# Patient Record
Sex: Female | Born: 1995 | Race: Black or African American | Hispanic: No | Marital: Single | Smoking: Never smoker
Health system: Southern US, Community
[De-identification: ages and names within clinical notes are randomized; demographics above are authoritative.]

## PROBLEM LIST (undated history)

## (undated) DIAGNOSIS — L732 Hidradenitis suppurativa: Secondary | ICD-10-CM

## (undated) DIAGNOSIS — F32A Depression, unspecified: Secondary | ICD-10-CM

## (undated) DIAGNOSIS — R87629 Unspecified abnormal cytological findings in specimens from vagina: Secondary | ICD-10-CM

## (undated) DIAGNOSIS — E119 Type 2 diabetes mellitus without complications: Secondary | ICD-10-CM

## (undated) DIAGNOSIS — G709 Myoneural disorder, unspecified: Secondary | ICD-10-CM

## (undated) DIAGNOSIS — I10 Essential (primary) hypertension: Secondary | ICD-10-CM

## (undated) DIAGNOSIS — F419 Anxiety disorder, unspecified: Secondary | ICD-10-CM

## (undated) DIAGNOSIS — T7840XA Allergy, unspecified, initial encounter: Secondary | ICD-10-CM

## (undated) DIAGNOSIS — K219 Gastro-esophageal reflux disease without esophagitis: Secondary | ICD-10-CM

## (undated) HISTORY — DX: Myoneural disorder, unspecified: G70.9

## (undated) HISTORY — DX: Unspecified abnormal cytological findings in specimens from vagina: R87.629

## (undated) HISTORY — PX: WISDOM TOOTH EXTRACTION: SHX21

## (undated) HISTORY — DX: Essential (primary) hypertension: I10

## (undated) HISTORY — DX: Gastro-esophageal reflux disease without esophagitis: K21.9

## (undated) HISTORY — DX: Depression, unspecified: F32.A

## (undated) HISTORY — DX: Anxiety disorder, unspecified: F41.9

## (undated) HISTORY — DX: Type 2 diabetes mellitus without complications: E11.9

## (undated) HISTORY — DX: Allergy, unspecified, initial encounter: T78.40XA

## (undated) HISTORY — DX: Hidradenitis suppurativa: L73.2

---
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
## 2016-01-01 ENCOUNTER — Telehealth: Payer: Self-pay | Admitting: *Deleted

## 2016-01-01 NOTE — Telephone Encounter (Signed)
Call placed to patient. LMTRC.  

## 2016-01-01 NOTE — Telephone Encounter (Signed)
-----   Message from Salley Scarlet, MD sent at 01/01/2016 11:08 AM EST ----- Regarding: FW: F/U CBG lantus down to 5 units Call and see what CBG are running I had decreased lantus to 5 units with her oral meds.  Put in chart notes  ----- Message -----    From: Salley Scarlet, MD    Sent: 12/30/2015      To: Salley Scarlet, MD Subject: F/U CBG lantus down to 5 units

## 2016-01-05 NOTE — Telephone Encounter (Signed)
Call placed to patient. LMTRC.  

## 2016-01-06 NOTE — Telephone Encounter (Signed)
Call placed to patient. LMTRC.  

## 2016-01-09 NOTE — Telephone Encounter (Signed)
Multiple calls placed to patient with no answer and no return call.   Message to be closed.  

## 2016-01-15 ENCOUNTER — Encounter: Payer: Self-pay | Admitting: Family Medicine

## 2016-02-24 ENCOUNTER — Ambulatory Visit (INDEPENDENT_AMBULATORY_CARE_PROVIDER_SITE_OTHER): Payer: No Typology Code available for payment source | Admitting: Family Medicine

## 2016-02-24 ENCOUNTER — Encounter: Payer: Self-pay | Admitting: Family Medicine

## 2016-02-24 VITALS — BP 132/80 | HR 78 | Temp 98.6°F | Resp 14 | Ht 66.0 in | Wt 213.0 lb

## 2016-02-24 DIAGNOSIS — Z2839 Other underimmunization status: Secondary | ICD-10-CM

## 2016-02-24 DIAGNOSIS — Z23 Encounter for immunization: Secondary | ICD-10-CM

## 2016-02-24 DIAGNOSIS — Z9189 Other specified personal risk factors, not elsewhere classified: Secondary | ICD-10-CM

## 2016-02-24 DIAGNOSIS — IMO0001 Reserved for inherently not codable concepts without codable children: Secondary | ICD-10-CM

## 2016-02-24 DIAGNOSIS — Z9289 Personal history of other medical treatment: Secondary | ICD-10-CM | POA: Diagnosis not present

## 2016-02-24 DIAGNOSIS — Z794 Long term (current) use of insulin: Secondary | ICD-10-CM | POA: Diagnosis not present

## 2016-02-24 DIAGNOSIS — E1165 Type 2 diabetes mellitus with hyperglycemia: Secondary | ICD-10-CM

## 2016-02-24 DIAGNOSIS — E669 Obesity, unspecified: Secondary | ICD-10-CM | POA: Diagnosis not present

## 2016-02-24 LAB — HEMOGLOBIN A1C, FINGERSTICK: Hgb A1C (fingerstick): 6.2 % — ABNORMAL HIGH (ref <5.7)

## 2016-02-24 NOTE — Assessment & Plan Note (Signed)
Goal is 10lb weight loss

## 2016-02-24 NOTE — Progress Notes (Signed)
Patient ID: Maria Aguilar, female   DOB: 06/10/1996, 20 y.o.   MRN: [id number]   Subjective:    Patient ID: Maria Aguilar, female    DOB: [birth year], 20 y.o.   MRN: [id number]  Patient presents for 3 month F/U Share follow-up chronic medical problems are diabetes mellitus type 2. Her last A1c was 6.1%. She is currently on Janumet 50/1000 one by mouth twice a day she is also on Lantus 5 units. She states her blood sugars have been about 150 fasting sometimes up to 200. She has intentionally lost 7 pounds since her last visit 3 months ago. She is trying to be more intentional but what she eats. She does eat a lot of [employer] and she works there and can get a meal for free. Next  She asked about a college immunizations she is missing an MMR as well as Varicella on her records she is also due for meningitis vaccines    Review Of Systems:  GEN- denies fatigue, fever, weight loss,weakness, recent illness HEENT- denies eye drainage, change in vision, nasal discharge, CVS- denies chest pain, palpitations RESP- denies SOB, cough, wheeze ABD- denies N/V, change in stools, abd pain GU- denies dysuria, hematuria, dribbling, incontinence MSK- denies joint pain, muscle aches, injury Neuro- denies headache, dizziness, syncope, seizure activity       Objective:    BP 132/80 mmHg  Pulse 78  Temp(Src) 98.6 F (37 C) (Oral)  Resp 14  Ht 5' 6"  (1.676 m)  Wt 213 lb (96.616 kg)  BMI 34.40 kg/m2 GEN- NAD, alert and oriented x3 HEENT- PERRL, EOMI, non injected sclera, pink conjunctiva, MMM, oropharynx clear CVS- RRR, no murmur RESP-CTAB EXT- No edema Pulses- Radial  2+  A1C 6.2%      Assessment & Plan:      Problem List Items Addressed This Visit    Obesity    Goal is 10lb weight loss      Diabetes mellitus type II, uncontrolled ([place]) - Primary    D/C Lantus Continue to work on dietary changes Referral to eye doctor given       Relevant Orders   Basic metabolic panel   CBC with  Differential/Platelet   Hemoglobin A1C, fingerstick   Lipid panel   Ambulatory referral to Ophthalmology    Other Visit Diagnoses    Incomplete immunization status        Relevant Orders    Varicella zoster antibody, IgG    Measles/Mumps/Rubella Immunity       Note: This dictation was prepared with Dragon dictation along with smaller phrase technology. Any transcriptional errors that result from this process are unintentional.

## 2016-02-24 NOTE — Addendum Note (Signed)
Addended by: Phillips OdorSIX, CHRISTINA H on: 02/24/2016 05:14 PM   Modules accepted: Orders

## 2016-02-24 NOTE — Assessment & Plan Note (Signed)
D/C Lantus Continue to work on dietary changes Referral to eye doctor given

## 2016-02-24 NOTE — Patient Instructions (Addendum)
Referral to eye doctor- Dr. Nile RiggsShapiro  Meningitis vaccine given We will call with lab results Stop the lantus Continue to work on diet and weight loss Goal 10 pounds by next visit F/U July before College for Physical

## 2016-02-25 LAB — MEASLES/MUMPS/RUBELLA IMMUNITY
Mumps IgG: 136 AU/mL — ABNORMAL HIGH (ref <9.00)
Rubella: 6.18 Index — ABNORMAL HIGH (ref <0.90)
Rubeola IgG: >300 AU/mL — ABNORMAL HIGH (ref <25.00)

## 2016-02-25 LAB — BASIC METABOLIC PANEL
BUN: 12 mg/dL (ref 7–20)
CALCIUM: 9.8 mg/dL (ref 8.9–10.4)
CHLORIDE: 104 mmol/L (ref 98–110)
CO2: 27 mmol/L (ref 20–31)
Creat: 0.52 mg/dL (ref 0.50–1.00)
Glucose, Bld: 81 mg/dL (ref 70–99)
Potassium: 4.2 mmol/L (ref 3.8–5.1)
SODIUM: 141 mmol/L (ref 135–146)

## 2016-02-25 LAB — LIPID PANEL
CHOL/HDL RATIO: 4.5 ratio (ref <=5.0)
CHOLESTEROL: 222 mg/dL — AB (ref 125–170)
HDL: 49 mg/dL (ref 36–76)
LDL Cholesterol: 146 mg/dL — ABNORMAL HIGH (ref <110)
TRIGLYCERIDES: 137 mg/dL — AB (ref 40–136)
VLDL: 27 mg/dL (ref <30)

## 2016-02-25 LAB — CBC WITH DIFFERENTIAL/PLATELET
BASOS ABS: 0 10*3/uL (ref 0.0–0.1)
BASOS PCT: 0 % (ref 0–1)
Eosinophils Absolute: 0.2 10*3/uL (ref 0.0–0.7)
Eosinophils Relative: 2 % (ref 0–5)
HCT: 38.9 % (ref 36.0–46.0)
HEMOGLOBIN: 12.4 g/dL (ref 12.0–15.0)
Lymphocytes Relative: 33 % (ref 12–46)
Lymphs Abs: 3.3 10*3/uL (ref 0.7–4.0)
MCH: 26.2 pg (ref 26.0–34.0)
MCHC: 31.9 g/dL (ref 30.0–36.0)
MCV: 82.2 fL (ref 78.0–100.0)
MONOS PCT: 7 % (ref 3–12)
MPV: 9.4 fL (ref 8.6–12.4)
Monocytes Absolute: 0.7 10*3/uL (ref 0.1–1.0)
NEUTROS ABS: 5.8 10*3/uL (ref 1.7–7.7)
NEUTROS PCT: 58 % (ref 43–77)
Platelets: 432 10*3/uL — ABNORMAL HIGH (ref 150–400)
RBC: 4.73 MIL/uL (ref 3.87–5.11)
RDW: 13.3 % (ref 11.5–15.5)
WBC: 10 10*3/uL (ref 4.0–10.5)

## 2016-02-25 LAB — VARICELLA ZOSTER ANTIBODY, IGG: Varicella IgG: 1250 Index — ABNORMAL HIGH (ref <135.00)

## 2016-03-03 LAB — HM DIABETES EYE EXAM

## 2016-03-09 ENCOUNTER — Encounter: Payer: Self-pay | Admitting: Family Medicine

## 2016-05-25 ENCOUNTER — Encounter: Payer: Self-pay | Admitting: Family Medicine

## 2016-05-26 ENCOUNTER — Ambulatory Visit (INDEPENDENT_AMBULATORY_CARE_PROVIDER_SITE_OTHER): Payer: No Typology Code available for payment source | Admitting: Family Medicine

## 2016-05-26 DIAGNOSIS — Z23 Encounter for immunization: Secondary | ICD-10-CM

## 2016-09-13 ENCOUNTER — Ambulatory Visit (INDEPENDENT_AMBULATORY_CARE_PROVIDER_SITE_OTHER): Payer: No Typology Code available for payment source | Admitting: Family Medicine

## 2016-09-13 ENCOUNTER — Encounter: Payer: Self-pay | Admitting: Family Medicine

## 2016-09-13 VITALS — BP 130/84 | HR 90 | Temp 97.9°F | Resp 18 | Wt 225.0 lb

## 2016-09-13 DIAGNOSIS — R300 Dysuria: Secondary | ICD-10-CM | POA: Diagnosis not present

## 2016-09-13 DIAGNOSIS — N898 Other specified noninflammatory disorders of vagina: Secondary | ICD-10-CM

## 2016-09-13 DIAGNOSIS — Z794 Long term (current) use of insulin: Secondary | ICD-10-CM

## 2016-09-13 DIAGNOSIS — Z6836 Body mass index (BMI) 36.0-36.9, adult: Secondary | ICD-10-CM

## 2016-09-13 DIAGNOSIS — E6609 Other obesity due to excess calories: Secondary | ICD-10-CM | POA: Diagnosis not present

## 2016-09-13 DIAGNOSIS — E1165 Type 2 diabetes mellitus with hyperglycemia: Secondary | ICD-10-CM

## 2016-09-13 DIAGNOSIS — IMO0001 Reserved for inherently not codable concepts without codable children: Secondary | ICD-10-CM

## 2016-09-13 LAB — URINALYSIS, ROUTINE W REFLEX MICROSCOPIC
BILIRUBIN URINE: NEGATIVE
Leukocytes, UA: NEGATIVE
NITRITE: NEGATIVE
Specific Gravity, Urine: 1.02 (ref 1.001–1.035)
pH: 5.5 (ref 5.0–8.0)

## 2016-09-13 LAB — URINALYSIS, MICROSCOPIC ONLY
CRYSTALS: NONE SEEN [HPF]
Casts: NONE SEEN [LPF]
Yeast: NONE SEEN [HPF]

## 2016-09-13 LAB — WET PREP FOR TRICH, YEAST, CLUE
Clue Cells Wet Prep HPF POC: NONE SEEN
Trich, Wet Prep: NONE SEEN

## 2016-09-13 LAB — HEMOGLOBIN A1C, FINGERSTICK: Hgb A1C (fingerstick): 10.8 % — ABNORMAL HIGH (ref <5.7)

## 2016-09-13 MED ORDER — CANAGLIFLOZIN 100 MG PO TABS: 100.0000 mg | ORAL_TABLET | Freq: Every day | ORAL | 2 refills | Status: DC

## 2016-09-13 MED ORDER — CLOTRIMAZOLE-BETAMETHASONE 1-0.05 % EX CREA: 1.0000 "application " | TOPICAL_CREAM | Freq: Two times a day (BID) | CUTANEOUS | 1 refills | Status: DC

## 2016-09-13 MED ORDER — FLUCONAZOLE 150 MG PO TABS: ORAL_TABLET | ORAL | 1 refills | Status: DC

## 2016-09-13 NOTE — Progress Notes (Signed)
   Subjective:    Patient ID: Maria Aguilar, female    DOB: [birth year], 20 y.o.   MRN: [id number]  Patient presents for office visit (itching, burning)  DM- Last A1C 6.2% , along with hyperlipidemia, she has been taking Janumet only missed a few doses per report, she has gained 12lbs in the past 6 months, she did not check her blood sugars until recently and she noted they were all in the 300's, highest 370. She has had itchy vaginal dicharge for past few months and dysuria for past few weeks. Sexually active with 1 partner  Currently in college  She missed appt in july  Review Of Systems:  GEN- denies fatigue, fever, weight loss,weakness, recent illness HEENT- denies eye drainage, change in vision, nasal discharge, CVS- denies chest pain, palpitations RESP- denies SOB, cough, wheeze ABD- denies N/V, change in stools, abd pain GU- denies dysuria, hematuria, dribbling, incontinence MSK- denies joint pain, muscle aches, injury Neuro- denies headache, dizziness, syncope, seizure activity       Objective:    BP 130/84   Pulse 90   Temp 97.9 F (36.6 C)   Resp 18   Wt 225 lb (102.1 kg)   LMP 09/07/2016 (Approximate)   SpO2 98%   BMI 36.32 kg/m  GEN- NAD, alert and oriented x3 HEENT- PERRL, EOMI, non injected sclera, pink conjunctiva, MMM, oropharynx clear CVS- RRR, no murmur RESP-CTAB ABD-NABS,soft,NT,ND GU- normal external genitalia erythema with white discharge in vaginal folds, vaginal mucosa pink and moist, cervix visualized no growth, no blood form os, + thick white discharge, no CMT, no ovarian masses, uterus normal size EXT- No edema Pulses- Radial  2+        Assessment & Plan:      Problem List Items Addressed This Visit    Obesity   Relevant Medications   canagliflozin (INVOKANA) 100 MG TABS tablet   Diabetes mellitus type II, uncontrolled (HCC)    Diabetes is uncontrolled resulted at 10.8% here in the office. She has concurrent overgrowth abuse.  Discussed the importance of adherence to her diet taking her medications regulate checking her blood sugar. I will start InvOKANA, in addition to the Janumet. I've also given her Diflucan she will treat these infection with this and will also use Lotrisone externally. She'll follow-up in one month with her blood sugar readings. I'm trying to avoid insulin as possible if she is in college and her blood sugars did improve with change in diet previously      Relevant Medications   canagliflozin (INVOKANA) 100 MG TABS tablet   Other Relevant Orders   CBC with Differential/Platelet   Comprehensive metabolic panel   Hemoglobin A1C, fingerstick (Completed)    Other Visit Diagnoses    Burning with urination    -  Primary   Relevant Orders   Urinalysis, Routine w reflex microscopic (not at [facility]) (Completed)   Vaginal discharge       Relevant Orders   GC/Chlamydia Probe Amp   WET PREP FOR TRICH, YEAST, CLUE (Completed)      Note: This dictation was prepared with Dragon dictation along with smaller phrase technology. Any transcriptional errors that result from this process are unintentional.

## 2016-09-13 NOTE — Patient Instructions (Signed)
Start the Invokana in addition to the Janumet  Check your blood sugar twice a day  Take the yeast medication as prescribed F/U 4 weeks with meter

## 2016-09-13 NOTE — Assessment & Plan Note (Signed)
Diabetes is uncontrolled resulted at 10.8% here in the office. She has concurrent overgrowth abuse. Discussed the importance of adherence to her diet taking her medications regulate checking her blood sugar. I will start InvOKANA, in addition to the Janumet. I've also given her Diflucan she will treat these infection with this and will also use Lotrisone externally. She'll follow-up in one month with her blood sugar readings. I'm trying to avoid insulin as possible if she is in college and her blood sugars did improve with change in diet previously

## 2016-09-14 ENCOUNTER — Other Ambulatory Visit: Payer: Self-pay | Admitting: Family Medicine

## 2016-09-14 DIAGNOSIS — E1165 Type 2 diabetes mellitus with hyperglycemia: Secondary | ICD-10-CM

## 2016-09-14 DIAGNOSIS — IMO0002 Reserved for concepts with insufficient information to code with codable children: Secondary | ICD-10-CM

## 2016-09-14 LAB — COMPREHENSIVE METABOLIC PANEL
ALBUMIN: 3.8 g/dL (ref 3.6–5.1)
ALT: 36 U/L — ABNORMAL HIGH (ref 5–32)
AST: 32 U/L (ref 12–32)
Alkaline Phosphatase: 83 U/L (ref 47–176)
BILIRUBIN TOTAL: 0.5 mg/dL (ref 0.2–1.1)
BUN: 9 mg/dL (ref 7–20)
CO2: 23 mmol/L (ref 20–31)
CREATININE: 0.67 mg/dL (ref 0.50–1.00)
Calcium: 9.3 mg/dL (ref 8.9–10.4)
Chloride: 100 mmol/L (ref 98–110)
Glucose, Bld: 349 mg/dL — ABNORMAL HIGH (ref 70–99)
Potassium: 4.4 mmol/L (ref 3.8–5.1)
SODIUM: 137 mmol/L (ref 135–146)
Total Protein: 6.9 g/dL (ref 6.3–8.2)

## 2016-09-14 LAB — CBC WITH DIFFERENTIAL/PLATELET
Basophils Absolute: 0 cells/uL (ref 0–200)
Basophils Relative: 0 %
EOS PCT: 2 %
Eosinophils Absolute: 198 cells/uL (ref 15–500)
HCT: 37.5 % (ref 35.0–45.0)
HEMOGLOBIN: 12.1 g/dL (ref 12.0–15.0)
LYMPHS ABS: 2970 {cells}/uL (ref 850–3900)
Lymphocytes Relative: 30 %
MCH: 26.2 pg — ABNORMAL LOW (ref 27.0–33.0)
MCHC: 32.3 g/dL (ref 32.0–36.0)
MCV: 81.2 fL (ref 80.0–100.0)
MPV: 9.7 fL (ref 7.5–12.5)
Monocytes Absolute: 495 cells/uL (ref 200–950)
Monocytes Relative: 5 %
NEUTROS ABS: 6237 {cells}/uL (ref 1500–7800)
NEUTROS PCT: 63 %
PLATELETS: 419 10*3/uL — AB (ref 140–400)
RBC: 4.62 MIL/uL (ref 3.80–5.10)
RDW: 13 % (ref 11.0–15.0)
WBC: 9.9 10*3/uL (ref 3.8–10.8)

## 2016-09-14 LAB — GC/CHLAMYDIA PROBE AMP
CT Probe RNA: NOT DETECTED
GC PROBE AMP APTIMA: NOT DETECTED

## 2016-10-15 ENCOUNTER — Encounter: Payer: Self-pay | Admitting: Family Medicine

## 2016-10-15 ENCOUNTER — Ambulatory Visit (INDEPENDENT_AMBULATORY_CARE_PROVIDER_SITE_OTHER): Payer: No Typology Code available for payment source | Admitting: Family Medicine

## 2016-10-15 VITALS — BP 128/78 | HR 82 | Temp 98.6°F | Resp 14 | Ht 66.0 in | Wt 230.0 lb

## 2016-10-15 DIAGNOSIS — E1165 Type 2 diabetes mellitus with hyperglycemia: Secondary | ICD-10-CM

## 2016-10-15 DIAGNOSIS — Z794 Long term (current) use of insulin: Secondary | ICD-10-CM | POA: Diagnosis not present

## 2016-10-15 DIAGNOSIS — B354 Tinea corporis: Secondary | ICD-10-CM | POA: Diagnosis not present

## 2016-10-15 DIAGNOSIS — IMO0001 Reserved for inherently not codable concepts without codable children: Secondary | ICD-10-CM

## 2016-10-15 MED ORDER — CANAGLIFLOZIN 100 MG PO TABS: 100.0000 mg | ORAL_TABLET | Freq: Every day | ORAL | 2 refills | Status: DC

## 2016-10-15 MED ORDER — SITAGLIPTIN PHOS-METFORMIN HCL 50-1000 MG PO TABS: 1.0000 | ORAL_TABLET | Freq: Two times a day (BID) | ORAL | 6 refills | Status: DC

## 2016-10-15 NOTE — Progress Notes (Signed)
   Subjective:    Patient ID: Maria Aguilar, female    DOB: October 08, 1996, 20 y.o.   MRN: [id number]  Patient presents for 1 month F/U (is not fasting) and Rash (small areas of irrititation to arms- looks like area has been scratched, but pt denies scratching area)   Patient here to follow diabetes mellitus this is a interim visit. Her last A1c was increased at 10.8% prior to that she had very good control at 6.2%. She admitted to not taking her medication as prescribed. She is here today she did not bring her glucometer she's not been checking her blood sugar. 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 She has not been following a proper diet. She has been taking the medication and ran out of the Invokana 1 week ago.  She has a rash on both arms a look like small Circular scaly spots that popped up she noticed them a couple days ago. Denies any itching pain she has not used anything on her skin.   Review Of Systems:  GEN- denies fatigue, fever, weight loss,weakness, recent illness HEENT- denies eye drainage, change in vision, nasal discharge, CVS- denies chest pain, palpitations RESP- denies SOB, cough, wheeze ABD- denies N/V, change in stools, abd pain GU- denies dysuria, hematuria, dribbling, incontinence MSK- denies joint pain, muscle aches, injury Neuro- denies headache, dizziness, syncope, seizure activity       Objective:    BP 128/78 (BP Location: Left Arm, Patient Position: Sitting, Cuff Size: Large)   Pulse 82   Temp 98.6 F (37 C) (Oral)   Resp 14   Ht 5\' 6"  (1.676 m)   Wt 230 lb (104.3 kg)   LMP 10/04/2016   BMI 37.12 kg/m  GEN- NAD, alert and oriented x3 HEENT- PERRL, EOMI, non injected sclera, pink conjunctiva, MMM, oropharynx clear CVS- RRR, no murmur RESP-CTAB Skin- 4 different ciruclar dime size to nickle size scaley patchy slightly raised, NT, no warmth, 1 with  hyperpigmented scab on lower half, no pustules - 3 on right and 1 on left arm  EXT- No edema Pulses- Radial 2+        Assessment & Plan:      Problem List Items Addressed This Visit    Diabetes mellitus type II, uncontrolled (HCC) - Primary    Uncontrolled, discussed importance of taking her blood sugars to get better readings so she can control her diabetes  more acurately Also adherence to diet needed Restart Invokana along with Janumet, we will get CBG readings from her in 2 weeks, via phone as she is in college   Rash looks like tinea infection in setting of her diabetes, she has lotrione at home, to use BID       Relevant Medications   canagliflozin (INVOKANA) 100 MG TABS tablet   sitaGLIPtin-metformin (JANUMET) 50-1000 MG tablet    Other Visit Diagnoses    Tinea corporis          Note: This dictation was prepared with Dragon dictation along with smaller phrase technology. Any transcriptional errors that result from this process are unintentional.

## 2016-10-15 NOTE — Patient Instructions (Addendum)
F/u - 2 months   Restart Invokana Use lotrisone on arm

## 2016-10-17 ENCOUNTER — Encounter: Payer: Self-pay | Admitting: Family Medicine

## 2016-10-17 NOTE — Assessment & Plan Note (Signed)
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

## 2016-10-24 ENCOUNTER — Emergency Department (HOSPITAL_COMMUNITY)
Admission: EM | Admit: 2016-10-24 | Discharge: 2016-10-24 | Disposition: A | Discharge status: Home / Self Care | Payer: PRIVATE HEALTH INSURANCE | Attending: Emergency Medicine | Admitting: Emergency Medicine

## 2016-10-24 ENCOUNTER — Encounter (HOSPITAL_COMMUNITY): Payer: Self-pay | Admitting: Emergency Medicine

## 2016-10-24 DIAGNOSIS — E119 Type 2 diabetes mellitus without complications: Secondary | ICD-10-CM | POA: Diagnosis not present

## 2016-10-24 DIAGNOSIS — N390 Urinary tract infection, site not specified: Secondary | ICD-10-CM | POA: Insufficient documentation

## 2016-10-24 DIAGNOSIS — R300 Dysuria: Secondary | ICD-10-CM | POA: Diagnosis present

## 2016-10-24 DIAGNOSIS — Z79899 Other long term (current) drug therapy: Secondary | ICD-10-CM | POA: Diagnosis not present

## 2016-10-24 LAB — URINE MICROSCOPIC-ADD ON

## 2016-10-24 LAB — URINALYSIS, ROUTINE W REFLEX MICROSCOPIC
BILIRUBIN URINE: NEGATIVE
GLUCOSE, UA: NEGATIVE mg/dL
KETONES UR: NEGATIVE mg/dL
Leukocytes, UA: NEGATIVE
Nitrite: POSITIVE — AB
PH: 6 (ref 5.0–8.0)

## 2016-10-24 LAB — POC URINE PREG, ED: Preg Test, Ur: NEGATIVE

## 2016-10-24 MED ORDER — CEPHALEXIN 500 MG PO CAPS
500.0000 mg | ORAL_CAPSULE | Freq: Once | ORAL | Status: AC
  Administered 2016-10-24: 500 mg via ORAL
  Filled 2016-10-24: qty 1

## 2016-10-24 MED ORDER — CEPHALEXIN 500 MG PO CAPS: 500.0000 mg | ORAL_CAPSULE | Freq: Four times a day (QID) | ORAL | 0 refills | Status: DC

## 2016-10-24 NOTE — ED Provider Notes (Signed)
[facility] DEPT Provider Note   CSN: [id number] Arrival date & time: 10/24/16  1828  By signing my name below, I, [name], attest that this documentation has been prepared under the direction and in the presence of [name], PA-C Electronically Signed: [name], ED Scribe. 10/24/16. 7:07 PM.   History   Chief Complaint Chief Complaint  Patient presents with  . Urinary Tract Infection    HPI Maria Aguilar is a 20 y.o. female with a PMHx of DM, who presents to the Emergency Department complaining of dysuria onset yesterday. Pt denies having an UTI in the past. Pt is having associated symptoms of lower abdominal pain, urinary pressure, hematuria, and urinary urgency. She notes that she has tried AZO and cranberry juice for the relief of her symptoms. She denies back pain, vomiting, fever, vaginal discharge, vaginal bleeding, and any other symptoms. Pt denies being sexually active at this time. Denies being on her menstrual cycle at this time and notes that her last menstrual period was 10/04/2016. States she has recently started a new medication for her diabetes and mother reports poor control of her blood sugars.      The history is provided by the patient. No language interpreter was used.  Urinary Tract Infection   This is a new problem. The current episode started yesterday. The problem occurs every urination. The problem has not changed since onset.The quality of the pain is described as burning. There has been no fever. Associated symptoms include hematuria and urgency. Pertinent negatives include no vomiting. Treatments tried: AZO and cranberry juice. Her past medical history does not include recurrent UTIs.    Past Medical History:  Diagnosis Date  . Diabetes mellitus without complication [facility])     Patient Active Problem List   Diagnosis Date Noted  . Obesity 07/07/2015  . Diabetes mellitus type II, uncontrolled ([facility]) 07/07/2015    Past Surgical  History:  Procedure Laterality Date  . WISDOM TOOTH EXTRACTION      OB History    Gravida Para Term Preterm AB Living             0   SAB TAB Ectopic Multiple Live Births                   Home Medications    Prior to Admission medications   Medication Sig Start Date End Date Taking? Authorizing Provider  Alcohol Swabs (ALCOHOL PADS) 70 % PADS Dispense based on patient and insurance preference. Use to monitor FSBS 3x daily. Dx: E11.65. 07/07/15   Alycia Rossetti, MD  blood glucose meter kit and supplies Dispense based on patient and insurance preference. Use to monitor FSBS 3x daily. Dx: E11.65. 07/07/15   Alycia Rossetti, MD  canagliflozin [facility]) 100 MG TABS tablet Take 1 tablet (100 mg total) by mouth daily before breakfast. 10/15/16   Alycia Rossetti, MD  clotrimazole-betamethasone (LOTRISONE) cream Apply 1 application topically 2 (two) times daily. 09/13/16   Alycia Rossetti, MD  Lancets Misc. KIT Dispense based on patient and insurance preference. Use to monitor FSBS 3x daily. Dx: E11.65. 07/07/15   Alycia Rossetti, MD  Lancets MISC Dispense based on patient and insurance preference. Use to monitor FSBS 3x daily. Dx: E11.65. 07/07/15   Alycia Rossetti, MD  norethindrone-ethinyl estradiol-iron (MICROGESTIN FE,GILDESS FE,LOESTRIN FE) 1.5-30 MG-MCG tablet Take 1 tablet by mouth at bedtime.    Historical Provider, MD  ONE TOUCH ULTRA TEST test strip USE ONE STRIP TO  CHECK GLUCOSE THREE TIMES DAILY 09/15/16   Alycia Rossetti, MD  sitaGLIPtin-metformin [facility]) 50-1000 MG tablet Take 1 tablet by mouth 2 (two) times daily with a meal. 10/15/16   Alycia Rossetti, MD    Family History Family History  Problem Relation Age of Onset  . Hypertension Mother   . Diabetes Mother     boarderline diabetic  . Diabetes Paternal Grandmother   . Diabetes Paternal Grandfather     Social History Social History  Substance Use Topics  . Smoking status: Never Smoker  . Smokeless tobacco:  Never Used  . Alcohol use No     Allergies   Patient has no known allergies.   Review of Systems Review of Systems  Constitutional: Negative for fever.  Gastrointestinal: Positive for abdominal pain (lower). Negative for vomiting.  Genitourinary: Positive for dysuria, hematuria and urgency. Negative for vaginal bleeding and vaginal discharge.  Musculoskeletal: Negative for back pain.     Physical Exam Updated Vital Signs BP 138/98 (BP Location: Left Arm)   Pulse 106   Temp 97.7 F (36.5 C) (Oral)   Resp 18   Ht 5' 6"  (1.676 m)   Wt 220 lb (99.8 kg)   LMP 10/04/2016   SpO2 98%   BMI 35.51 kg/m   Physical Exam  Constitutional: She is oriented to person, place, and time. She appears well-developed and well-nourished. No distress.  HENT:  Head: Normocephalic and atraumatic.  Mouth/Throat: Oropharynx is clear and moist.  Eyes: Conjunctivae are normal.  Neck: Normal range of motion. Neck supple.  Cardiovascular: Normal rate, regular rhythm and normal heart sounds.  Exam reveals no gallop and no friction rub.   No murmur heard. Pulmonary/Chest: Effort normal and breath sounds normal. No respiratory distress. She has no wheezes. She has no rales.  Abdominal: Soft. She exhibits no distension. There is tenderness in the suprapubic area. There is no CVA tenderness.  Mild suprapubic tenderness. No CVA tenderness.   Musculoskeletal: Normal range of motion.  No midline spinal tenderness.   Neurological: She is alert and oriented to person, place, and time.  Skin: Skin is warm and dry.  Psychiatric: She has a normal mood and affect. Her behavior is normal.  Nursing note and vitals reviewed.    ED Treatments / Results  DIAGNOSTIC STUDIES: Oxygen Saturation is 98% on RA, nl by my interpretation.    COORDINATION OF CARE: 7:06 PM Discussed treatment plan with pt at bedside which includes UA and pt agreed to plan.   Labs (all labs ordered are listed, but only abnormal  results are displayed) Labs Reviewed  URINALYSIS, ROUTINE W REFLEX MICROSCOPIC (NOT AT [facility]) - Abnormal; Notable for the following:       Result Value   Color, Urine STRAW (*)    Specific Gravity, Urine <1.005 (*)    Hgb urine dipstick LARGE (*)    Protein, ur TRACE (*)    Nitrite POSITIVE (*)    All other components within normal limits  URINE MICROSCOPIC-ADD ON - Abnormal; Notable for the following:    Squamous Epithelial / LPF 0-5 (*)    Bacteria, UA FEW (*)    All other components within normal limits  URINE CULTURE  POC URINE PREG, ED    Procedures Procedures (including critical care time)  Medications Ordered in ED Medications - No data to display   Initial Impression / Assessment and Plan / ED Course  I have reviewed the triage vital signs and the nursing notes.  Pertinent labs that were available during my care of the patient were reviewed by me and considered in my medical decision making (see chart for details).  Clinical Course     Pt is well appearing.  Mucous membranes are moist.  Ambulates to restroom with steady gait.  No clinical concerns for DKA.  Drinking water.  Vitals stable.    UTI, no CVA tenderness, vomiting or fever to suggest pyelonephritis.  Appears stable for d/c.  Will start KEflex, urine culture pending.  Pt counseled on importance of blood sugar control and close PMD f/u.  Appears stable for d/c.  Return precautions given.  Final Clinical Impressions(s) / ED Diagnoses   Final diagnoses:  Urinary tract infection in female    New Prescriptions New Prescriptions   No medications on file   I personally performed the services described in this documentation, which was scribed in my presence. The recorded information has been reviewed and is accurate.     [name], PA-C 10/25/16 2106    Jola Schmidt, MD 10/26/16 [phone]

## 2016-10-24 NOTE — ED Triage Notes (Signed)
Pt reports burning with urination. Symptoms started yesterday.

## 2016-10-24 NOTE — Discharge Instructions (Signed)
Its important to drink plenty of water, monitor your blood sugar closely and follow-up with your doctor.  Take the antibiotic as directed until its finished.  Return for any worsening symptoms such as increasing pain, vomiting or fever.

## 2016-10-24 NOTE — ED Notes (Signed)
Pt states understanding of care given and follow up instructions.  Pt A/O x 4, ambulated with steady gait from ED with parents.

## 2016-10-26 ENCOUNTER — Ambulatory Visit (INDEPENDENT_AMBULATORY_CARE_PROVIDER_SITE_OTHER): Payer: No Typology Code available for payment source | Admitting: Family Medicine

## 2016-10-26 ENCOUNTER — Encounter: Payer: Self-pay | Admitting: Family Medicine

## 2016-10-26 VITALS — BP 122/72 | HR 100 | Temp 99.0°F | Resp 18 | Ht 66.0 in | Wt 227.0 lb

## 2016-10-26 DIAGNOSIS — R310 Gross hematuria: Secondary | ICD-10-CM

## 2016-10-26 DIAGNOSIS — N3001 Acute cystitis with hematuria: Secondary | ICD-10-CM

## 2016-10-26 LAB — URINALYSIS, ROUTINE W REFLEX MICROSCOPIC
BILIRUBIN URINE: NEGATIVE
Glucose, UA: NEGATIVE
LEUKOCYTES UA: NEGATIVE
Nitrite: NEGATIVE
pH: 6 (ref 5.0–8.0)

## 2016-10-26 LAB — URINALYSIS, MICROSCOPIC ONLY
Casts: NONE SEEN [LPF]
Crystals: NONE SEEN [HPF]
YEAST: NONE SEEN [HPF]

## 2016-10-26 LAB — URINE CULTURE

## 2016-10-26 MED ORDER — PHENAZOPYRIDINE HCL 100 MG PO TABS: 100.0000 mg | ORAL_TABLET | Freq: Three times a day (TID) | ORAL | 0 refills | Status: DC | PRN

## 2016-10-26 MED ORDER — FLUCONAZOLE 150 MG PO TABS: ORAL_TABLET | ORAL | 1 refills | Status: DC

## 2016-10-26 MED ORDER — CEFTRIAXONE SODIUM 1 G IJ SOLR
500.0000 mg | Freq: Once | INTRAMUSCULAR | Status: AC
  Administered 2016-10-26: 500 mg via INTRAMUSCULAR

## 2016-10-26 MED ORDER — CANAGLIFLOZIN 100 MG PO TABS: 100.0000 mg | ORAL_TABLET | Freq: Every day | ORAL | 2 refills | Status: DC

## 2016-10-26 NOTE — Patient Instructions (Addendum)
Give note for school for Monday and Tuesday Take the diflucan Take the keflex at least 5 hours apart  This evening take around 5pm then between 10-11pm F/U as previous

## 2016-10-26 NOTE — Progress Notes (Signed)
   Subjective:    Patient ID: Maria Aguilar, female    DOB: 07-10-1996, 20 y.o.   MRN: [id number]  Patient presents for Dysuria (x3 days- heamturia, burning with urination- was seen at ED and given Keflex- continues to voice C/O abd pain and burning)   Patient here with dysuria and lower abdominal pain and urinary frequency for the past few days. She went to the emergency room on Sunday night because the pain. She was diagnosed with UTI she was getting Keflex prescription she's taken a full 24-hour course. She also used Pyridium one day which turn her urine red. She denies any fever or nausea vomiting. She's not had any vaginal discharge. She has not been checking her blood sugar she plans to pick up the Invokana today.    Review Of Systems:  GEN- denies fatigue, fever, weight loss,weakness, recent illness HEENT- denies eye drainage, change in vision, nasal discharge, CVS- denies chest pain, palpitations RESP- denies SOB, cough, wheeze ABD- denies N/V, change in stools, abd pain GU- + dysuria, +hematuria, dribbling, incontinence MSK- denies joint pain, muscle aches, injury Neuro- denies headache, dizziness, syncope, seizure activity       Objective:    BP 122/72 (BP Location: Left Arm, Patient Position: Sitting, Cuff Size: Large)   Pulse 100   Temp 99 F (37.2 C) (Oral)   Resp 18   Ht 5\' 6"  (1.676 m)   Wt 227 lb (103 kg)   LMP 10/04/2016   SpO2 98%   BMI 36.64 kg/m  GEN- NAD, alert and oriented x3 CVS- RRR, no murmur RESP-CTAB ABD-NABS,soft, mild suprapubic tenderness ND, no CVA tenderess Pulses- Radial 2+        Assessment & Plan:      Problem List Items Addressed This Visit    None    Visit Diagnoses    Acute cystitis with hematuria    -  Primary   Will give Rocephin 500 mg. She's not had any antibiotics today. Discussed how to take the Keflex. Culture pending from emergency room. She has significant blood, not on menses High risk for Yeast With her diabetic  medications and antibiotics therefore have given her Diflucan and instructions on use  Her mother was with her today.    Relevant Medications   cefTRIAXone (ROCEPHIN) injection 500 mg (Completed)   Other Relevant Orders   Urinalysis, Routine w reflex microscopic [facility]) (Completed)   Urine culture   Maria FellersFrank hematuria       Relevant Orders   Urinal[facility], [facility] w reflex microscopic [facility]) (Completed)   Urine culture      Note: This dictation was prepared with Dragon dictation along with smaller phrase technology. Any transcriptional errors that result from this process are unintentional.

## 2016-10-27 LAB — URINE CULTURE: ORGANISM ID, BACTERIA: NO GROWTH

## 2016-12-02 ENCOUNTER — Encounter: Payer: Self-pay | Admitting: Family Medicine

## 2016-12-02 ENCOUNTER — Ambulatory Visit (INDEPENDENT_AMBULATORY_CARE_PROVIDER_SITE_OTHER): Payer: No Typology Code available for payment source | Admitting: Family Medicine

## 2016-12-02 VITALS — BP 122/76 | HR 100 | Temp 98.3°F | Resp 16 | Ht 66.0 in | Wt 227.0 lb

## 2016-12-02 DIAGNOSIS — R300 Dysuria: Secondary | ICD-10-CM | POA: Diagnosis not present

## 2016-12-02 DIAGNOSIS — N3001 Acute cystitis with hematuria: Secondary | ICD-10-CM

## 2016-12-02 LAB — URINALYSIS, MICROSCOPIC ONLY
Casts: NONE SEEN [LPF]
Crystals: NONE SEEN [HPF]
Yeast: NONE SEEN [HPF]

## 2016-12-02 LAB — URINALYSIS, ROUTINE W REFLEX MICROSCOPIC
Bilirubin Urine: NEGATIVE
Ketones, ur: NEGATIVE
LEUKOCYTES UA: NEGATIVE
Nitrite: NEGATIVE
Specific Gravity, Urine: 1.02 (ref 1.001–1.035)
pH: 5.5 (ref 5.0–8.0)

## 2016-12-02 MED ORDER — NITROFURANTOIN MONOHYD MACRO 100 MG PO CAPS: 100.0000 mg | ORAL_CAPSULE | Freq: Two times a day (BID) | ORAL | 0 refills | Status: DC

## 2016-12-02 NOTE — Progress Notes (Signed)
 Subjective:    Patient ID: Maria Aguilar, female    DOB: 08/21/1996, 20 y.o.   MRN: [id number]  HPI Patient symptoms began 2 days ago. She reports frequent episodes of dysuria. She also reports increased urinary frequency. She reports urgency. However she also reports hesitancy. She denies any CVA tenderness. She denies any nausea vomiting. She denies any fever. She does report trace hematuria. She is not sexually active. She's not had sex in over a year. Therefore she does not believe there is any chance she could be pregnant. She is on birth control. She denies any vaginal discharge. Past Medical History:  Diagnosis Date  . Diabetes mellitus without complication (HCC)    Past Surgical History:  Procedure Laterality Date  . WISDOM TOOTH EXTRACTION     Current Outpatient Prescriptions on File Prior to Visit  Medication Sig Dispense Refill  . Alcohol Swabs (ALCOHOL PADS) 70 % PADS Dispense based on patient and insurance preference. Use to monitor FSBS 3x daily. Dx: E11.65. 100 each 11  . blood glucose meter kit and supplies Dispense based on patient and insurance preference. Use to monitor FSBS 3x daily. Dx: E11.65. 1 each 0  . canagliflozin (INVOKANA) 100 MG TABS tablet Take 1 tablet (100 mg total) by mouth daily before breakfast. 30 tablet 2  . clotrimazole-betamethasone (LOTRISONE) cream Apply 1 application topically 2 (two) times daily. 45 g 1  . Lancets Misc. KIT Dispense based on patient and insurance preference. Use to monitor FSBS 3x daily. Dx: E11.65. 1 each 0  . Lancets MISC Dispense based on patient and insurance preference. Use to monitor FSBS 3x daily. Dx: E11.65. 100 each 11  . norethindrone-ethinyl estradiol-iron (MICROGESTIN FE,GILDESS FE,LOESTRIN FE) 1.5-30 MG-MCG tablet Take 1 tablet by mouth at bedtime.    . ONE TOUCH ULTRA TEST test strip USE ONE STRIP TO CHECK GLUCOSE THREE TIMES DAILY 100 each 11  . sitaGLIPtin-metformin (JANUMET) 50-1000 MG tablet Take 1 tablet by  mouth 2 (two) times daily with a meal. 60 tablet 6  . fluconazole (DIFLUCAN) 150 MG tablet Take 1 tablet then repeat in 3 days (Patient not taking: Reported on 12/02/2016) 2 tablet 1  . phenazopyridine (PYRIDIUM) 100 MG tablet Take 1 tablet (100 mg total) by mouth 3 (three) times daily as needed for pain. (Patient not taking: Reported on 12/02/2016) 10 tablet 0   No current facility-administered medications on file prior to visit.    No Known Allergies Social History   Social History  . Marital status: Single    Spouse name: N/A  . Number of children: N/A  . Years of education: N/A   Occupational History  . Not on file.   Social History Main Topics  . Smoking status: Never Smoker  . Smokeless tobacco: Never Used  . Alcohol use No  . Drug use: No  . Sexual activity: Not Currently    Birth control/ protection: Pill   Other Topics Concern  . Not on file   Social History Narrative  . No narrative on file      Review of Systems  All other systems reviewed and are negative.      Objective:   Physical Exam  Cardiovascular: Normal rate, regular rhythm and normal heart sounds.   Pulmonary/Chest: Effort normal and breath sounds normal.  Abdominal: Soft. Bowel sounds are normal. She exhibits no distension. There is no tenderness. There is no rebound.  Vitals reviewed.         Assessment & Plan:    Burning with urination - Plan: Urinalysis, Routine w reflex microscopic  Acute cystitis with hematuria  Symptoms are classic and consistent with a urinary tract infection. Macrobid 100 mg pobid for 7 days.

## 2016-12-02 NOTE — Addendum Note (Signed)
Addended by: Legrand RamsWILLIS, SANDY B on: 12/02/2016 02:49 PM   Modules accepted: Orders

## 2016-12-03 LAB — URINE CULTURE

## 2016-12-15 ENCOUNTER — Ambulatory Visit (INDEPENDENT_AMBULATORY_CARE_PROVIDER_SITE_OTHER): Payer: No Typology Code available for payment source | Admitting: Family Medicine

## 2016-12-15 ENCOUNTER — Encounter: Payer: Self-pay | Admitting: Family Medicine

## 2016-12-15 VITALS — BP 118/62 | HR 80 | Temp 98.2°F | Resp 12 | Ht 66.0 in | Wt 228.0 lb

## 2016-12-15 DIAGNOSIS — IMO0001 Reserved for inherently not codable concepts without codable children: Secondary | ICD-10-CM

## 2016-12-15 DIAGNOSIS — Z794 Long term (current) use of insulin: Secondary | ICD-10-CM

## 2016-12-15 DIAGNOSIS — E6609 Other obesity due to excess calories: Secondary | ICD-10-CM | POA: Diagnosis not present

## 2016-12-15 DIAGNOSIS — Z6836 Body mass index (BMI) 36.0-36.9, adult: Secondary | ICD-10-CM

## 2016-12-15 DIAGNOSIS — E1165 Type 2 diabetes mellitus with hyperglycemia: Secondary | ICD-10-CM

## 2016-12-15 LAB — COMPREHENSIVE METABOLIC PANEL
ALBUMIN: 3.8 g/dL (ref 3.6–5.1)
ALT: 10 U/L (ref 5–32)
AST: 12 U/L (ref 12–32)
Alkaline Phosphatase: 69 U/L (ref 47–176)
BUN: 10 mg/dL (ref 7–20)
CALCIUM: 9.6 mg/dL (ref 8.9–10.4)
CHLORIDE: 100 mmol/L (ref 98–110)
CO2: 24 mmol/L (ref 20–31)
Creat: 0.67 mg/dL (ref 0.50–1.00)
Glucose, Bld: 293 mg/dL — ABNORMAL HIGH (ref 70–99)
POTASSIUM: 4.6 mmol/L (ref 3.8–5.1)
Sodium: 134 mmol/L — ABNORMAL LOW (ref 135–146)
Total Bilirubin: 0.3 mg/dL (ref 0.2–1.1)
Total Protein: 7.3 g/dL (ref 6.3–8.2)

## 2016-12-15 LAB — CBC WITH DIFFERENTIAL/PLATELET
BASOS PCT: 0 %
Basophils Absolute: 0 cells/uL (ref 0–200)
EOS ABS: 236 {cells}/uL (ref 15–500)
Eosinophils Relative: 2 %
HEMATOCRIT: 37.6 % (ref 35.0–45.0)
HEMOGLOBIN: 12.2 g/dL (ref 12.0–15.0)
LYMPHS ABS: 3422 {cells}/uL (ref 850–3900)
Lymphocytes Relative: 29 %
MCH: 26.5 pg — ABNORMAL LOW (ref 27.0–33.0)
MCHC: 32.4 g/dL (ref 32.0–36.0)
MCV: 81.6 fL (ref 80.0–100.0)
MONO ABS: 590 {cells}/uL (ref 200–950)
MONOS PCT: 5 %
MPV: 9.3 fL (ref 7.5–12.5)
NEUTROS ABS: 7552 {cells}/uL (ref 1500–7800)
Neutrophils Relative %: 64 %
PLATELETS: 370 10*3/uL (ref 140–400)
RBC: 4.61 MIL/uL (ref 3.80–5.10)
RDW: 13.1 % (ref 11.0–15.0)
WBC: 11.8 10*3/uL — ABNORMAL HIGH (ref 3.8–10.8)

## 2016-12-15 LAB — LIPID PANEL
CHOL/HDL RATIO: 4.2 ratio (ref <5.0)
Cholesterol: 184 mg/dL — ABNORMAL HIGH (ref <170)
HDL: 44 mg/dL — AB (ref >45)
LDL Cholesterol: 114 mg/dL — ABNORMAL HIGH (ref <110)
Triglycerides: 130 mg/dL — ABNORMAL HIGH (ref <90)
VLDL: 26 mg/dL (ref <30)

## 2016-12-15 LAB — HEMOGLOBIN A1C, FINGERSTICK: Hgb A1C (fingerstick): 8.5 % — ABNORMAL HIGH (ref <5.7)

## 2016-12-15 MED ORDER — SITAGLIPTIN PHOS-METFORMIN HCL 50-1000 MG PO TABS: 1.0000 | ORAL_TABLET | Freq: Two times a day (BID) | ORAL | 6 refills | Status: DC

## 2016-12-15 MED ORDER — GLIPIZIDE 5 MG PO TABS: 5.0000 mg | ORAL_TABLET | Freq: Every day | ORAL | 6 refills | Status: DC

## 2016-12-15 NOTE — Patient Instructions (Addendum)
A1C is 8.5% Take the glipizide with the janument in the morning, you must eat with this Check your blood sugars F/U 6 weeks with readings

## 2016-12-15 NOTE — Progress Notes (Signed)
   Subjective:    Patient ID: Maria PastelSharon Whitelock, female    DOB: 12-20-1995, 21 y.o.   MRN: [id number]  Patient presents for 2 month F/U (is fasting) Patient here to follow-up diabetes mellitus. She was seen last week secondary to urinary tract infection. She stopped Invokana this is the second UTI and she's also had yeast infections with it. She also ran out of her Janumet over a week ago states that her blood sugars have been high the last time she checked it was 400. Last A1c was 10.8%. She admits to not eating correctly has not been exercising she did recently go back to college for her spring semester. She has no new concerns today.    Review Of Systems:  GEN- denies fatigue, fever, weight loss,weakness, recent illness HEENT- denies eye drainage, change in vision, nasal discharge, CVS- denies chest pain, palpitations RESP- denies SOB, cough, wheeze ABD- denies N/V, change in stools, abd pain GU- denies dysuria, hematuria, dribbling, incontinence MSK- denies joint pain, muscle aches, injury Neuro- denies headache, dizziness, syncope, seizure activity       Objective:    BP 118/62 (BP Location: Left Arm, Patient Position: Sitting, Cuff Size: Large)   Pulse 80   Temp 98.2 F (36.8 C) (Oral)   Resp 12   Ht 5\' 6"  (1.676 m)   Wt 228 lb (103.4 kg)   LMP 11/29/2016 (Approximate) Comment: regular  SpO2 99%   BMI 36.80 kg/m  GEN- NAD, alert and oriented x3 HEENT- PERRL, EOMI, non injected sclera, pink conjunctiva, MMM, oropharynx clear Neck- Supple, no thyromegaly CVS- RRR, no murmur RESP-CTAB EXT- No edema Pulses- Radial, DP- 2+        Assessment & Plan:      Problem List Items Addressed This Visit    Obesity   Relevant Medications   sitaGLIPtin-metformin (JANUMET) 50-1000 MG tablet   glipiZIDE (GLUCOTROL) 5 MG tablet   Other Relevant Orders   Lipid panel (Completed)   Diabetes mellitus type II, uncontrolled (HCC) - Primary    Uncontrolled diabetes though it has  improved with oral therapy. Her A1c today in office was 8.5% improvement from 10.8% 3 months ago. This is at the flow situation and she is a [employer] student will like to try to avoid injections to keep her compliant. Even though her weight is an issue since she cannot take this class of medications currently are on Januvia and metformin I discussed that a low-dose of glipizide 5 mg to her Janumet. I've given her another co-pay card with her insurance is only $5 for the Janumet. She is to check her blood sugars on a regular basis. She will follow up in 6 weeks.  At the next visit we will get another urine sample to do a urine micral. She would benefit from low-dose ACE inhibitor for him trying to get her consistent with taking her regular diabetic medications, almost every visit there's been some lapse in her medications      Relevant Medications   sitaGLIPtin-metformin (JANUMET) 50-1000 MG tablet   glipiZIDE (GLUCOTROL) 5 MG tablet   Other Relevant Orders   Hemoglobin A1C, fingerstick (Completed)   Comprehensive metabolic panel (Completed)   CBC with Differential/Platelet (Completed)   Lipid panel (Completed)      Note: This dictation was prepared with Dragon dictation along with smaller phrase technology. Any transcriptional errors that result from this process are unintentional.

## 2016-12-16 ENCOUNTER — Encounter: Payer: Self-pay | Admitting: Family Medicine

## 2016-12-16 NOTE — Assessment & Plan Note (Signed)
Uncontrolled diabetes though it has improved with oral therapy. Her A1c today in office was 8.5% improvement from 10.8% 3 months ago. This is at the flow situation and she is a [employer] student will like to try to avoid injections to keep her compliant. Even though her weight is an issue since she cannot take this class of medications currently are on Januvia and metformin I discussed that a low-dose of glipizide 5 mg to her Janumet. I've given her another co-pay card with her insurance is only $5 for the Janumet. She is to check her blood sugars on a regular basis. She will follow up in 6 weeks.  At the next visit we will get another urine sample to do a urine micral. She would benefit from low-dose ACE inhibitor for him trying to get her consistent with taking her regular diabetic medications, almost every visit there's been some lapse in her medications

## 2017-01-19 ENCOUNTER — Encounter: Payer: Self-pay | Admitting: Family Medicine

## 2017-01-19 ENCOUNTER — Ambulatory Visit: Payer: No Typology Code available for payment source | Admitting: Physician Assistant

## 2017-01-19 ENCOUNTER — Ambulatory Visit (INDEPENDENT_AMBULATORY_CARE_PROVIDER_SITE_OTHER): Payer: No Typology Code available for payment source | Admitting: Family Medicine

## 2017-01-19 VITALS — BP 120/68 | HR 98 | Temp 98.7°F | Resp 14 | Ht 66.0 in | Wt 230.0 lb

## 2017-01-19 DIAGNOSIS — E1165 Type 2 diabetes mellitus with hyperglycemia: Secondary | ICD-10-CM | POA: Diagnosis not present

## 2017-01-19 DIAGNOSIS — B9789 Other viral agents as the cause of diseases classified elsewhere: Secondary | ICD-10-CM | POA: Diagnosis not present

## 2017-01-19 DIAGNOSIS — R319 Hematuria, unspecified: Secondary | ICD-10-CM | POA: Diagnosis not present

## 2017-01-19 DIAGNOSIS — J069 Acute upper respiratory infection, unspecified: Secondary | ICD-10-CM | POA: Diagnosis not present

## 2017-01-19 DIAGNOSIS — N390 Urinary tract infection, site not specified: Secondary | ICD-10-CM

## 2017-01-19 DIAGNOSIS — Z794 Long term (current) use of insulin: Secondary | ICD-10-CM | POA: Diagnosis not present

## 2017-01-19 DIAGNOSIS — IMO0001 Reserved for inherently not codable concepts without codable children: Secondary | ICD-10-CM

## 2017-01-19 LAB — URINALYSIS, ROUTINE W REFLEX MICROSCOPIC
Bilirubin Urine: NEGATIVE
Glucose, UA: NEGATIVE
Leukocytes, UA: NEGATIVE
NITRITE: NEGATIVE
PH: 6 (ref 5.0–8.0)

## 2017-01-19 LAB — URINALYSIS, MICROSCOPIC ONLY
CRYSTALS: NONE SEEN [HPF]
Yeast: NONE SEEN [HPF]

## 2017-01-19 MED ORDER — CEPHALEXIN 500 MG PO CAPS: 500.0000 mg | ORAL_CAPSULE | Freq: Three times a day (TID) | ORAL | 0 refills | Status: DC

## 2017-01-19 NOTE — Patient Instructions (Addendum)
[facility]

## 2017-01-19 NOTE — Assessment & Plan Note (Signed)
CBG are improving, again addressed compliance with checking Nutrition and exercise No change to regimen F/U 2 months for fasting labs

## 2017-01-19 NOTE — Progress Notes (Signed)
   Subjective:    Patient ID: Maria Aguilar, female    DOB: [birth year], 21 y.o.   MRN: [id number]  Patient presents for Illness (x1 week- throat pain that has resolved, nasal congestion, sneezing, nonproductive cough at night) and Dysuria (x1 day- burning with urination)   Sore throat, congestion , sneezing 1 week ago, lost voice, throat is now improve, still has some cough at night. Nyquil    Dysuria started 2 days ago, no fever, mild cramping , noted blood after wiping ,feels like previous UTI    DM- currently on Janumet and Glipizide- highest 200, average 160,last A1C 8.5%   started with nutrition/exercise class at college Tuesday/Thursday but has missed due to multiple "things" coming up  LMP- Due in 3-4 days , on OCP   Review Of Systems:  GEN- denies fatigue, fever, weight loss,weakness, recent illness HEENT- denies eye drainage, change in vision, nasal discharge, CVS- denies chest pain, palpitations RESP- denies SOB, cough, wheeze ABD- denies N/V, change in stools, abd pain GU- +dysuria, +hematuria, dribbling, incontinence MSK- denies joint pain, muscle aches, injury Neuro- denies headache, dizziness, syncope, seizure activity       Objective:    BP 120/68   Pulse 98   Temp 98.7 F (37.1 C) (Oral)   Resp 14   Ht 5\' 6"  (1.676 m)   Wt 230 lb (104.3 kg)   SpO2 99%   BMI 37.12 kg/m  GEN- NAD, alert and oriented x3 HEENT- PERRL, EOMI, non injected sclera, pink conjunctiva, MMM, oropharynx clear Neck- Supple, no  LAD  CVS- RRR, no murmur RESP-CTAB ABD-NABS,soft,NT,ND, no CVA tenderness  Pulses- Radial  2+        Assessment & Plan:      Problem List Items Addressed This Visit    Diabetes mellitus type II, uncontrolled (HCC)    CBG are improving, again addressed compliance with checking Nutrition and exercise No change to regimen F/U 2 months for fasting labs        Other Visit Diagnoses    Urinary tract infection with hematuria, site unspecified    -   Primary   concern for UTI based on history and syptoms, start Keflex, has pyridum okay to use, incresae water, culture sent    Relevant Medications   cephALEXin (KEFLEX) 500 MG capsule   Other Relevant Orders   Urinalysis, Routine w reflex microscopic   Urine culture   Viral URI       Resolved, advised cough may be at night for another week    Relevant Medications   cephALEXin (KEFLEX) 500 MG capsule      Note: This dictation was prepared with Dragon dictation along with smaller phrase technology. Any transcriptional errors that result from this process are unintentional.

## 2017-01-21 LAB — URINE CULTURE

## 2017-01-26 ENCOUNTER — Ambulatory Visit: Payer: No Typology Code available for payment source | Admitting: Family Medicine

## 2017-02-07 ENCOUNTER — Telehealth: Payer: Self-pay | Admitting: Family Medicine

## 2017-02-07 ENCOUNTER — Other Ambulatory Visit: Payer: No Typology Code available for payment source

## 2017-02-07 ENCOUNTER — Other Ambulatory Visit: Payer: Self-pay | Admitting: *Deleted

## 2017-02-07 DIAGNOSIS — R300 Dysuria: Secondary | ICD-10-CM

## 2017-02-07 LAB — URINALYSIS, ROUTINE W REFLEX MICROSCOPIC
Bilirubin Urine: NEGATIVE
GLUCOSE, UA: NEGATIVE
NITRITE: NEGATIVE
PH: 6 (ref 5.0–8.0)
SPECIFIC GRAVITY, URINE: 1.03 (ref 1.001–1.035)

## 2017-02-07 LAB — URINALYSIS, MICROSCOPIC ONLY
CASTS: NONE SEEN [LPF]
CRYSTALS: NONE SEEN [HPF]
Yeast: NONE SEEN [HPF]

## 2017-02-07 MED ORDER — PHENAZOPYRIDINE HCL 100 MG PO TABS: 100.0000 mg | ORAL_TABLET | Freq: Three times a day (TID) | ORAL | 0 refills | Status: DC | PRN

## 2017-02-07 MED ORDER — CEPHALEXIN 500 MG PO CAPS: 500.0000 mg | ORAL_CAPSULE | Freq: Four times a day (QID) | ORAL | 0 refills | Status: DC

## 2017-02-07 NOTE — Telephone Encounter (Signed)
She can come leave a sample to see if true infection She can use pyridium, but often if blood sugar is high she will have urinary freqyency

## 2017-02-07 NOTE — Telephone Encounter (Signed)
Call placed to patient to inquire.   States that she does not have Sx at this time, but feels like she is starting to get them (urinary frequency and urgency). States that she is getting frequent Sx almost monthly.   Requested MD to advise on prevention. Given recommendations on drinking plenty of water, relieving self often, and hygiene.

## 2017-02-07 NOTE — Telephone Encounter (Signed)
Call placed to patient and patient made aware.   Patient will leave sample today.

## 2017-02-07 NOTE — Telephone Encounter (Signed)
Call placed to patient and urine samples discussed.

## 2017-02-07 NOTE — Telephone Encounter (Signed)
[phone] Patient calling to say she has another uti, she is getting one every month it seems, would like to  Talk to you about maybe doing something different

## 2017-02-07 NOTE — Telephone Encounter (Signed)
Patient was here today for a urine sample, however she was never called about her lab results on the 14th and mom was a little concerned about this please call her at [phone] or [phone]

## 2017-02-10 ENCOUNTER — Telehealth: Payer: Self-pay | Admitting: Family Medicine

## 2017-02-10 LAB — URINE CULTURE

## 2017-02-10 NOTE — Telephone Encounter (Signed)
Pt called in stating that she is having back pain associated with her UTI she is taking her antibiotic as prescribed but the symptoms are not easing off. Wants to know what she can do or if something can be called in for her.

## 2017-02-10 NOTE — Telephone Encounter (Signed)
Call placed to patient and patient made aware.   Appointment scheduled.  

## 2017-02-10 NOTE — Telephone Encounter (Signed)
Culture showing sensitivity to all antibiotics so the Keflex should be adequate She was not called with the prescription until 02/07/17 at 4:21 PM.  Therefore, has only received about 48 hours of antibiotics so it is possible that it is just taking a while for the symptoms to resolve. Schedule her follow-up office visit with Dr. Jeanice Limurham for late in the day Friday (tomorrow) so that she can be evaluated if the symptoms are persisting up to that point--- but if symptoms resolve in the interim--- can cancel that appointment.

## 2017-02-10 NOTE — Telephone Encounter (Signed)
Patient continues to voice c/o lower abdominal pain and urinary frequency.   Patient is currently taking Pyridium and Keflex. She has increased the amount of water to try to flush system.   Urine culture has been completed.   Please advise.

## 2017-02-11 ENCOUNTER — Ambulatory Visit (INDEPENDENT_AMBULATORY_CARE_PROVIDER_SITE_OTHER): Payer: No Typology Code available for payment source | Admitting: Family Medicine

## 2017-02-11 ENCOUNTER — Encounter: Payer: Self-pay | Admitting: Family Medicine

## 2017-02-11 VITALS — BP 130/68 | HR 80 | Temp 98.8°F | Resp 14 | Ht 66.0 in | Wt 230.0 lb

## 2017-02-11 DIAGNOSIS — N390 Urinary tract infection, site not specified: Secondary | ICD-10-CM

## 2017-02-11 DIAGNOSIS — B379 Candidiasis, unspecified: Secondary | ICD-10-CM

## 2017-02-11 DIAGNOSIS — Z794 Long term (current) use of insulin: Secondary | ICD-10-CM

## 2017-02-11 DIAGNOSIS — E1165 Type 2 diabetes mellitus with hyperglycemia: Secondary | ICD-10-CM

## 2017-02-11 DIAGNOSIS — IMO0001 Reserved for inherently not codable concepts without codable children: Secondary | ICD-10-CM

## 2017-02-11 LAB — URINALYSIS, ROUTINE W REFLEX MICROSCOPIC
BILIRUBIN URINE: NEGATIVE
Glucose, UA: NEGATIVE
KETONES UR: NEGATIVE
LEUKOCYTES UA: NEGATIVE
NITRITE: NEGATIVE
Specific Gravity, Urine: 1.03 (ref 1.001–1.035)
pH: 6 (ref 5.0–8.0)

## 2017-02-11 LAB — URINALYSIS, MICROSCOPIC ONLY
CASTS: NONE SEEN [LPF]
CRYSTALS: NONE SEEN [HPF]
Yeast: NONE SEEN [HPF]

## 2017-02-11 MED ORDER — NYSTATIN 100000 UNIT/GM EX CREA: 1.0000 "application " | TOPICAL_CREAM | Freq: Two times a day (BID) | CUTANEOUS | 1 refills | Status: DC

## 2017-02-11 MED ORDER — FLUCONAZOLE 150 MG PO TABS: 150.0000 mg | ORAL_TABLET | Freq: Once | ORAL | 1 refills | Status: AC

## 2017-02-11 NOTE — Patient Instructions (Addendum)
Finish antibiotics as prescribed Take yeast pill Use cream Water  Check your blood sugars  We will call in 2 weeks for blood sugars F/U as previous

## 2017-02-11 NOTE — Progress Notes (Signed)
   Subjective:    Patient ID: Maria Aguilar Maria Aguilar, female    DOB: Jan 03, 1996, 21 y.o.   MRN: [id number]  Patient presents for UTI (has increased lower abdominal pain/ back pain would lie referral to urology)  Pt here with UTI , continues to have pressure. She is currently on Keflex .Had pansensitive E coli on 3/5 DIabetes is uncontrolled last A1C 8.5%, not checking her sugars, states she does like food at school and "doesnt eat" however has not lost any weight. She also admits to vaginal itching, denies sexual activity, declines GU exam    Has had 2 documented UTI in past 4 months    Review Of Systems:  GEN- denies fatigue, fever, weight loss,weakness, recent illness HEENT- denies eye drainage, change in vision, nasal discharge, CVS- denies chest pain, palpitations RESP- denies SOB, cough, wheeze ABD- denies N/V, change in stools, abd pain GU- +dysuria, denies hematuria, dribbling, incontinence MSK- denies joint pain, muscle aches, injury Neuro- denies headache, dizziness, syncope, seizure activity       Objective:    BP 130/68   Pulse 80   Temp 98.8 F (37.1 C) (Oral)   Resp 14   Ht 5\' 6"  (1.676 m)   Wt 230 lb (104.3 kg)   SpO2 99%   BMI 37.12 kg/m  GEN- NAD, alert and oriented x3 HEENT- PERRL, EOMI, non injected sclera, pink conjunctiva, MMM, oropharynx clear CVS- RRR, no murmur RESP-CTAB ABD-NABS,soft,mild TTP suprapubic tenderness, no cva tenderness  EXT- No edema Pulses- Radial 2+        Assessment & Plan:      Problem List Items Addressed This Visit    Diabetes mellitus type II, uncontrolled (HCC)    Mother also present, discussed how diabetes effects infections and yeast growth. Discussed her dietary habits and need to check CBG daily to keep her self accountable Increase water I dont see a reason to send to urology right now unless she does clear her infections.  DIscussed this with pt and mother, they agree to hold off for now       Other Visit  Diagnoses    Urinary tract infection without hematuria, site unspecified    -  Primary   Repeat UA shows improvement, repeat Culture complete keflex, treat for yeast empiracally diflucan and topical    Relevant Medications   nystatin cream (MYCOSTATIN)   Other Relevant Orders   Urinalysis, Routine w reflex microscopic (Completed)   Urine culture (Completed)   Yeast infection       Relevant Medications   nystatin cream (MYCOSTATIN)      Note: This dictation was prepared with Dragon dictation along with smaller phrase technology. Any transcriptional errors that result from this process are unintentional.

## 2017-02-13 ENCOUNTER — Encounter: Payer: Self-pay | Admitting: Family Medicine

## 2017-02-13 LAB — URINE CULTURE: ORGANISM ID, BACTERIA: NO GROWTH

## 2017-02-13 NOTE — Assessment & Plan Note (Signed)
Mother also present, discussed how diabetes effects infections and yeast growth. Discussed her dietary habits and need to check CBG daily to keep her self accountable Increase water I dont see a reason to send to urology right now unless she does clear her infections.  DIscussed this with pt and mother, they agree to hold off for now

## 2017-02-14 ENCOUNTER — Ambulatory Visit: Payer: No Typology Code available for payment source | Admitting: Family Medicine

## 2017-02-23 ENCOUNTER — Encounter: Payer: Self-pay | Admitting: Family Medicine

## 2017-03-02 ENCOUNTER — Telehealth: Payer: Self-pay | Admitting: *Deleted

## 2017-03-02 NOTE — Telephone Encounter (Signed)
-----   Message from Salley ScarletKawanta F [place], MD sent at 03/01/2017  1:46 PM EDT ----- Regarding: FW: f/u CBG  I need CBG readings  ----- Message ----- From: Salley ScarletKawanta F [place], MD Sent: 02/28/2017 To: Salley ScarletKawanta F [place], MD Subject: f/u CBG

## 2017-03-02 NOTE — Telephone Encounter (Signed)
Blood sugars look much better, keep taking same dose of medication

## 2017-03-02 NOTE — Telephone Encounter (Signed)
Call placed to patient. VM full.  

## 2017-03-02 NOTE — Telephone Encounter (Signed)
Patient returned call. States that CBG readings are as follows:   AM  PM 3/11   167 3/12 185  92  3/13 157 3/14   161 3/15   128 3/16 178 3/19   152 3/20 194 3/28 97  Of note, patient is taking Glipizide 5mg  QD and Janumet 50/1000mg  BID.

## 2017-03-03 NOTE — Telephone Encounter (Signed)
Call placed to patient. No answer. VM full. 

## 2017-03-07 NOTE — Telephone Encounter (Signed)
Call placed to patient. No answer. VM full. 

## 2017-03-08 NOTE — Telephone Encounter (Signed)
Patient returned call and made aware.

## 2017-03-22 ENCOUNTER — Ambulatory Visit (INDEPENDENT_AMBULATORY_CARE_PROVIDER_SITE_OTHER): Payer: No Typology Code available for payment source | Admitting: Family Medicine

## 2017-03-22 ENCOUNTER — Encounter: Payer: Self-pay | Admitting: Family Medicine

## 2017-03-22 VITALS — BP 126/82 | HR 78 | Temp 98.4°F | Resp 14 | Ht 66.0 in | Wt 238.0 lb

## 2017-03-22 DIAGNOSIS — IMO0001 Reserved for inherently not codable concepts without codable children: Secondary | ICD-10-CM

## 2017-03-22 DIAGNOSIS — E1165 Type 2 diabetes mellitus with hyperglycemia: Secondary | ICD-10-CM

## 2017-03-22 DIAGNOSIS — E6609 Other obesity due to excess calories: Secondary | ICD-10-CM | POA: Diagnosis not present

## 2017-03-22 DIAGNOSIS — Z6836 Body mass index (BMI) 36.0-36.9, adult: Secondary | ICD-10-CM

## 2017-03-22 DIAGNOSIS — Z794 Long term (current) use of insulin: Secondary | ICD-10-CM | POA: Diagnosis not present

## 2017-03-22 NOTE — Assessment & Plan Note (Addendum)
The blood sugar readings she is telling me today shows some improvement. Is still difficulty with her diet and her weight. Imaging get her concentrated on the healthy eating and exercise and her diabetes will be under better control. I would also be able to stop the sulfonylurea which could be causing some weight gain as well. 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 She did not tolerate the SGL2 because of increasing urinary infections and vaginitis  She is at least being more consistent with her current meds Hold on statin drug May benefit from ACEI in the future if I can get her consistent on here regular DM meds

## 2017-03-22 NOTE — Patient Instructions (Signed)
F/U 4 months  

## 2017-03-22 NOTE — Progress Notes (Signed)
   Subjective:    Patient ID: Maria Aguilar, female    DOB: Jun 11, 1996, 21 y.o.   MRN: [id number]  Patient presents for Follow-up A she had a follow-up diabetes mellitus. Her last A1c was 8.5% He is not checking her blood sugars daily but states that she has checked in some the highest it has been as 180s typically 1:30 to 160 when she is fasting. She is taking her glipizide in her Janumet daily. She has gained 8 pounds since her last visit a month ago. She has not had any UTI symptoms and no yeast infections. No concerns today     Review Of Systems:  GEN- denies fatigue, fever, weight loss,weakness, recent illness HEENT- denies eye drainage, change in vision, nasal discharge, CVS- denies chest pain, palpitations RESP- denies SOB, cough, wheeze ABD- denies N/V, change in stools, abd pain GU- denies dysuria, hematuria, dribbling, incontinence MSK- denies joint pain, muscle aches, injury Neuro- denies headache, dizziness, syncope, seizure activity       Objective:    BP 126/82   Pulse 78   Temp 98.4 F (36.9 C) (Oral)   Resp 14   Ht  (1.676 m)   Wt 238 lb (108 kg)   LMP 03/19/2017 (Approximate) Comment: regular  SpO2 99%   BMI 38.41 kg/m  GEN- NAD, alert and oriented x3 HEENT- PERRL, EOMI, non injected sclera, pink conjunctiva, MMM, oropharynx clear CVS- RRR, no murmur RESP-CTAB EXT- No edema Pulses- Radial, DP- 2+        Assessment & Plan:      Problem List Items Addressed This Visit    Obesity   Diabetes mellitus type II, uncontrolled (HCC) - Primary    The blood sugar readings she is telling me today shows some improvement. Is still difficulty with her diet and her weight. Imaging get her concentrated on the healthy eating and exercise and her diabetes will be under better control. I would also be able to stop the sulfonylurea which could be causing some weight gain as well. 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 She did not tolerate the SGL2 because of increasing urinary infections and vaginitis  She is at least being more consistent with her current meds Hold on statin drug May benefit from ACEI in the future if I can get her consistent on here regular DM meds      Relevant Orders   Basic metabolic panel   Hemoglobin A1c   HM DIABETES FOOT EXAM (Completed)      Note: This dictation was prepared with Dragon dictation along with smaller phrase technology. Any transcLenny Pastelrors that result from this process are unintentional.

## 2017-03-23 LAB — BASIC METABOLIC PANEL
BUN: 12 mg/dL (ref 7–25)
CHLORIDE: 105 mmol/L (ref 98–110)
CO2: 24 mmol/L (ref 20–31)
Calcium: 9.7 mg/dL (ref 8.6–10.2)
Creat: 0.61 mg/dL (ref 0.50–1.10)
GLUCOSE: 67 mg/dL — AB (ref 70–99)
Potassium: 4.3 mmol/L (ref 3.5–5.3)
Sodium: 143 mmol/L (ref 135–146)

## 2017-03-23 LAB — HEMOGLOBIN A1C
HEMOGLOBIN A1C: 6.7 % — AB (ref <5.7)
MEAN PLASMA GLUCOSE: 146 mg/dL

## 2017-04-05 ENCOUNTER — Encounter: Payer: Self-pay | Admitting: Family Medicine

## 2017-04-28 ENCOUNTER — Telehealth: Payer: Self-pay | Admitting: *Deleted

## 2017-04-28 ENCOUNTER — Encounter: Payer: Self-pay | Admitting: Physician Assistant

## 2017-04-28 ENCOUNTER — Ambulatory Visit (INDEPENDENT_AMBULATORY_CARE_PROVIDER_SITE_OTHER): Payer: No Typology Code available for payment source | Admitting: Physician Assistant

## 2017-04-28 VITALS — BP 118/86 | HR 90 | Temp 98.3°F | Resp 16 | Wt 239.6 lb

## 2017-04-28 DIAGNOSIS — R3129 Other microscopic hematuria: Secondary | ICD-10-CM | POA: Diagnosis not present

## 2017-04-28 DIAGNOSIS — B3731 Acute candidiasis of vulva and vagina: Secondary | ICD-10-CM

## 2017-04-28 DIAGNOSIS — B373 Candidiasis of vulva and vagina: Secondary | ICD-10-CM | POA: Diagnosis not present

## 2017-04-28 DIAGNOSIS — R300 Dysuria: Secondary | ICD-10-CM

## 2017-04-28 DIAGNOSIS — R310 Gross hematuria: Secondary | ICD-10-CM | POA: Diagnosis not present

## 2017-04-28 LAB — URINALYSIS, ROUTINE W REFLEX MICROSCOPIC
Bilirubin Urine: NEGATIVE
KETONES UR: NEGATIVE
LEUKOCYTES UA: NEGATIVE
NITRITE: NEGATIVE
SPECIFIC GRAVITY, URINE: 1.03 (ref 1.001–1.035)
pH: 6 (ref 5.0–8.0)

## 2017-04-28 LAB — URINALYSIS, MICROSCOPIC ONLY
CRYSTALS: NONE SEEN [HPF]
Casts: NONE SEEN [LPF]
Yeast: NONE SEEN [HPF]

## 2017-04-28 MED ORDER — FLUCONAZOLE 150 MG PO TABS: 150.0000 mg | ORAL_TABLET | Freq: Once | ORAL | 0 refills | Status: AC

## 2017-04-28 NOTE — Telephone Encounter (Signed)
Received request from pharmacy for PA on Janumet.   PA submitted.   Dx: E11.65.

## 2017-04-28 NOTE — Progress Notes (Signed)
Patient ID: Maria Aguilar MRN: [id number], DOB: [birth year], 21 y.o. Date of Encounter: 04/28/2017, 4:03 PM    Chief Complaint:  Chief Complaint  Patient presents with  . Dysuria    no discharge  . Hematuria     HPI: 21 y.o. year old female presents with above.   She states that yesterday she still noticed some dysuria and also some blood in her urine when she went to the bathroom yesterday. Says that her last menstrual period was about 2 weeks ago. Says that she has had these same symptoms and is also seen blood in her urine when she has had prior urinary infections so was feeling quite certain that this was a urinary tract infection.  She has had no back pain. No fevers or chills.    Home Meds:   Outpatient Medications Prior to Visit  Medication Sig Dispense Refill  . Alcohol Swabs (ALCOHOL PADS) 70 % PADS Dispense based on patient and insurance preference. Use to monitor FSBS 3x daily. Dx: E11.65. 100 each 11  . blood glucose meter kit and supplies Dispense based on patient and insurance preference. Use to monitor FSBS 3x daily. Dx: E11.65. 1 each 0  . glipiZIDE (GLUCOTROL) 5 MG tablet Take 1 tablet (5 mg total) by mouth daily before breakfast. 30 tablet 6  . Lancets Misc. KIT Dispense based on patient and insurance preference. Use to monitor FSBS 3x daily. Dx: E11.65. 1 each 0  . Lancets MISC Dispense based on patient and insurance preference. Use to monitor FSBS 3x daily. Dx: E11.65. 100 each 11  . norethindrone-ethinyl estradiol-iron (MICROGESTIN FE,GILDESS FE,LOESTRIN FE) 1.5-30 MG-MCG tablet Take 1 tablet by mouth at bedtime.    Marland Kitchen nystatin cream (MYCOSTATIN) Apply 1 application topically 2 (two) times daily. 30 g 1  . ONE TOUCH ULTRA TEST test strip USE ONE STRIP TO CHECK GLUCOSE THREE TIMES DAILY 100 each 11  . phenazopyridine (PYRIDIUM) 100 MG tablet Take 1 tablet (100 mg total) by mouth 3 (three) times daily as needed for pain. 10 tablet 0  . sitaGLIPtin-metformin  (JANUMET) 50-1000 MG tablet Take 1 tablet by mouth 2 (two) times daily with a meal. 60 tablet 6   No facility-administered medications prior to visit.     Allergies: No Known Allergies    Review of Systems: See HPI for pertinent ROS. All other ROS negative.    Physical Exam: Blood pressure 118/86, pulse 90, temperature 98.3 F (36.8 C), temperature source Oral, resp. rate 16, weight 239 lb 9.6 oz (108.7 kg), SpO2 99 %., Body mass index is 38.67 kg/m. General:  Obese AAF. Appears in no acute distress. Neck: Supple. No thyromegaly. No lymphadenopathy. Lungs: Clear bilaterally to auscultation without wheezes, rales, or rhonchi. Breathing is unlabored. Heart: Regular rhythm. No murmurs, rubs, or gallops. Abdomen: Soft, non-tender, non-distended with normoactive bowel sounds. No hepatomegaly. No rebound/guarding. No obvious abdominal masses. Msk:  Strength and tone normal for age. Extremities/Skin: Warm and dry.  Neuro: Alert and oriented X 3. Moves all extremities spontaneously. Gait is normal. CNII-XII grossly in tact. Psych:  Responds to questions appropriately with a normal affect.   Urinalysis is not in the computer for me to pull in result but I do have it the report and it is showing blood 2+ nitrite negative leukocyte negative. I course Maria Aguilar is showing WBC 0-5 RBC 3-10 bacteria few yeast few otherwise other results normal. Results for orders placed or performed in visit on 04/28/17  Urinalysis, Routine w  reflex microscopic  Result Value Ref Range   Color, Urine YELLOW YELLOW   APPearance CLOUDY (A) CLEAR   Specific Gravity, Urine 1.030 1.001 - 1.035   pH 6.0 5.0 - 8.0   Glucose, UA 2+ (A) NEGATIVE   Bilirubin Urine NEGATIVE NEGATIVE   Ketones, ur NEGATIVE NEGATIVE   Hgb urine dipstick 2+ (A) NEGATIVE   Protein, ur 2+ (A) NEGATIVE   Nitrite NEGATIVE NEGATIVE   Leukocytes, UA NEGATIVE NEGATIVE  Urine Microscopic  Result Value Ref Range   WBC, UA 0-5 <=5 WBC/HPF   RBC  / HPF 3-10 (A) <=2 RBC/HPF   Squamous Epithelial / LPF 6-10 (A) <=5 HPF   Bacteria, UA FEW (A) NONE SEEN HPF   Crystals NONE SEEN NONE SEEN HPF   Casts NONE SEEN NONE SEEN LPF   Yeast NONE SEEN NONE SEEN HPF     ASSESSMENT AND PLAN:  21 y.o. year old female with  1. Vaginal candidiasis Spoke with lab tech--she had circled + yeast on sheet that I reviewed at [facility]---but when she entered result in computer--marked no yeast-- - fluconazole (DIFLUCAN) 150 MG tablet; Take 1 tablet (150 mg total) by mouth once.  Dispense: 1 tablet; Refill: 0  2. Hematuria, gross - Ambulatory referral to Urology  3. Microscopic hematuria - Ambulatory referral to Urology - Urine culture  Patient states that she has had multiple urinary tract infections and every time she has UTI, she sees blood in her urine just like she did with this episode. She is questioning the fact that I am telling her that urinalysis is showing no indication of UTI today. Discussed that I will send a urine culture to be sure.  Also, I reviewed her chart. She has come in for multiple visits with similar symptoms and has had multiple urinalysis and urine cultures. Some of these the urinalysis and urine culture both indicated infection. However multiple of these the urinalysis was not consistent with UTI and the culture also was not consistent with UTI. However all of these did have blood in the urine. Discussed that I do think she needs follow-up with the urologist to evaluate hematuria. Discussed that she does not need antibiotics at this time but will send urine culture and if this shows infection thenwill add antibiotic. Discussed that today's urinalysis does show yeast and this may be causing her current symptoms and this can be treated with Diflucan.    Maria Aguilar [place], [place], [facility] 04/28/2017 4:03 PM

## 2017-04-28 NOTE — Telephone Encounter (Signed)
[employer] is reviewing your PA request. Typically an electronic response will be received within 72 hours. To check for an update later, open this request from your dashboard.    You may close this dialog and return to your dashboard to perform other tasks.

## 2017-04-29 LAB — URINE CULTURE

## 2017-05-03 MED ORDER — SITAGLIPTIN PHOS-METFORMIN HCL 50-1000 MG PO TABS: 1.0000 | ORAL_TABLET | Freq: Two times a day (BID) | ORAL | 6 refills | Status: DC

## 2017-05-03 NOTE — Telephone Encounter (Signed)
Received PA determination.   [id number] approved through 04/28/2018.   Pharmacy made aware.

## 2017-05-11 ENCOUNTER — Telehealth: Payer: Self-pay | Admitting: *Deleted

## 2017-05-11 NOTE — Telephone Encounter (Signed)
Per [facility] patient has appointment on 06/27/17 At 3:00 with Dr. Mena GoesEskridge.

## 2017-05-25 ENCOUNTER — Ambulatory Visit: Payer: No Typology Code available for payment source | Admitting: Family Medicine

## 2017-06-03 ENCOUNTER — Encounter: Payer: Self-pay | Admitting: Family Medicine

## 2017-06-03 ENCOUNTER — Ambulatory Visit (INDEPENDENT_AMBULATORY_CARE_PROVIDER_SITE_OTHER): Payer: No Typology Code available for payment source | Admitting: Family Medicine

## 2017-06-03 DIAGNOSIS — S161XXD Strain of muscle, fascia and tendon at neck level, subsequent encounter: Secondary | ICD-10-CM | POA: Diagnosis not present

## 2017-06-03 DIAGNOSIS — K30 Functional dyspepsia: Secondary | ICD-10-CM

## 2017-06-03 MED ORDER — RANITIDINE HCL 150 MG PO TABS: 150.0000 mg | ORAL_TABLET | Freq: Every day | ORAL | 0 refills | Status: DC

## 2017-06-03 NOTE — Patient Instructions (Addendum)
Release of records- [facility] [place]  MWF Take the blood sugar  Take the zantac at bedtime FOR 2 WEEKS  F/u AS PREVIOUS
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
## 2017-07-22 ENCOUNTER — Ambulatory Visit: Payer: No Typology Code available for payment source | Admitting: Family Medicine

## 2017-07-29 ENCOUNTER — Other Ambulatory Visit: Payer: Self-pay | Admitting: Family Medicine

## 2017-11-16 ENCOUNTER — Encounter: Payer: Self-pay | Admitting: Family Medicine

## 2017-12-23 ENCOUNTER — Encounter: Payer: Self-pay | Admitting: Family Medicine

## 2018-05-11 LAB — BASIC METABOLIC PANEL
BUN: 8 (ref 4–21)
CREATININE: 0.7 (ref <=1.1)

## 2018-05-11 LAB — TSH: TSH: 1.77 (ref <=5.90)

## 2018-05-11 LAB — HEMOGLOBIN A1C: HEMOGLOBIN A1C: 11.9

## 2018-06-27 ENCOUNTER — Encounter: Payer: Self-pay | Admitting: "Endocrinology

## 2018-06-27 ENCOUNTER — Ambulatory Visit (INDEPENDENT_AMBULATORY_CARE_PROVIDER_SITE_OTHER): Payer: PRIVATE HEALTH INSURANCE | Admitting: "Endocrinology

## 2018-06-27 VITALS — BP 125/80 | HR 86 | Ht 66.0 in | Wt 229.0 lb

## 2018-06-27 DIAGNOSIS — E1165 Type 2 diabetes mellitus with hyperglycemia: Secondary | ICD-10-CM

## 2018-06-27 DIAGNOSIS — Z6836 Body mass index (BMI) 36.0-36.9, adult: Secondary | ICD-10-CM

## 2018-06-27 DIAGNOSIS — I10 Essential (primary) hypertension: Secondary | ICD-10-CM

## 2018-06-27 DIAGNOSIS — E6609 Other obesity due to excess calories: Secondary | ICD-10-CM | POA: Diagnosis not present

## 2018-06-27 MED ORDER — GLUCOSE BLOOD VI STRP
ORAL_STRIP | 2 refills | Status: DC
Start: 2018-06-27 — End: 2018-06-29

## 2018-06-27 MED ORDER — ACCU-CHEK GUIDE W/DEVICE KIT: 1.0000 | PACK | 0 refills | Status: DC

## 2018-06-27 NOTE — Patient Instructions (Signed)

## 2018-06-27 NOTE — Progress Notes (Signed)
Endocrinology Consult Note       06/27/2018, 6:40 PM   Subjective:    Patient ID: Maria Aguilar, female    DOB: 1996/04/11.  Talullah Abate is being seen in consultation for management of currently uncontrolled symptomatic diabetes requested by  Alycia Rossetti, MD.   Past Medical History:  Diagnosis Date  . Diabetes mellitus without complication [facility])    Past Surgical History:  Procedure Laterality Date  . WISDOM TOOTH EXTRACTION     Social History   Socioeconomic History  . Marital status: Single    Spouse name: Not on file  . Number of children: Not on file  . Years of education: Not on file  . Highest education level: Not on file  Occupational History  . Not on file  Social Needs  . Financial resource strain: Not on file  . Food insecurity:    Worry: Not on file    Inability: Not on file  . Transportation needs:    Medical: Not on file    Non-medical: Not on file  Tobacco Use  . Smoking status: Never Smoker  . Smokeless tobacco: Never Used  Substance and Sexual Activity  . Alcohol use: No  . Drug use: No  . Sexual activity: Not Currently    Birth control/protection: Pill  Lifestyle  . Physical activity:    Days per week: Not on file    Minutes per session: Not on file  . Stress: Not on file  Relationships  . Social connections:    Talks on phone: Not on file    Gets together: Not on file    Attends religious service: Not on file    Active member of club or organization: Not on file    Attends meetings of clubs or organizations: Not on file    Relationship status: Not on file  Other Topics Concern  . Not on file  Social History Narrative  . Not on file   Outpatient Encounter Medications as of 06/27/2018  Medication Sig  . Alcohol Swabs (ALCOHOL PADS) 70 % PADS Dispense based on patient and insurance preference. Use to monitor FSBS 3x daily. Dx: E11.65.  . blood  glucose meter kit and supplies Dispense based on patient and insurance preference. Use to monitor FSBS 3x daily. Dx: E11.65.  Marland Kitchen Blood Glucose Monitoring Suppl (ACCU-CHEK GUIDE) w/Device KIT 1 Piece by Does not apply route as directed.  Marland Kitchen glucose blood (ACCU-CHEK GUIDE) test strip Use as instructed  . Lancets MISC Dispense based on patient and insurance preference. Use to monitor FSBS 3x daily. Dx: E11.65.  . norethindrone-ethinyl estradiol-iron (MICROGESTIN FE,GILDESS FE,LOESTRIN FE) 1.5-30 MG-MCG tablet Take 1 tablet by mouth at bedtime.  . sitaGLIPtin-metformin (JANUMET) 50-1000 MG tablet Take 1 tablet by mouth 2 (two) times daily with a meal.  . [DISCONTINUED] cyclobenzaprine (FLEXERIL) 5 MG tablet take 1 tablet by mouth three times a day for 7 days  . [DISCONTINUED] glipiZIDE (GLUCOTROL) 5 MG tablet TAKE ONE TABLET BY MOUTH ONCE DAILY IN THE MORNING BEFORE  BREAKFAST  . [DISCONTINUED] Lancets Misc. KIT Dispense based on patient and insurance preference. Use to monitor  FSBS 3x daily. Dx: E11.65.  . [DISCONTINUED] nystatin cream (MYCOSTATIN) Apply 1 application topically 2 (two) times daily.  . [DISCONTINUED] ONE TOUCH ULTRA TEST test strip USE ONE STRIP TO CHECK GLUCOSE THREE TIMES DAILY  . [DISCONTINUED] ranitidine (ZANTAC) 150 MG tablet Take 1 tablet (150 mg total) by mouth at bedtime.   No facility-administered encounter medications on file as of 06/27/2018.     ALLERGIES: No Known Allergies  VACCINATION STATUS: Immunization History  Administered Date(s) Administered  . DTaP 04/17/1997, 06/14/1997, 08/21/1997, 11/20/1998  . Hepatitis B 1997/03/14, 04/17/1997, 08/21/1997  . HiB (PRP-OMP) 04/17/1997, 06/14/1997, 11/20/1998  . IPV 04/17/1997, 06/14/1997, 11/20/1998  . MMR 11/20/1998, 05/26/2016  . Meningococcal B, OMV 02/24/2016, 05/26/2016  . Meningococcal Conjugate 02/24/2016  . Meningococcal Polysaccharide 05/20/2008  . Pneumococcal Polysaccharide-23 09/26/2015  . Td 05/04/2008   . Tdap 05/04/2008    Diabetes  She presents for her initial diabetic visit. She has type 2 diabetes mellitus. Onset time: She was diagnosed at approximate age of 22 years. Her disease course has been worsening. There are no hypoglycemic associated symptoms. Pertinent negatives for hypoglycemia include no confusion, headaches, pallor or seizures. There are no diabetic associated symptoms. Pertinent negatives for diabetes include no chest pain, no polydipsia, no polyphagia and no polyuria. There are no hypoglycemic complications. Symptoms are worsening. Risk factors for coronary artery disease include diabetes mellitus, dyslipidemia, obesity and sedentary lifestyle. Current diabetic treatment includes oral agent (dual therapy). Her weight is fluctuating minimally. She is following a generally unhealthy diet. When asked about meal planning, she reported none. She has had a previous visit with a dietitian. She rarely participates in exercise. Her home blood glucose trend is increasing steadily. (She did not bring any meter nor logs to review.  Her most recent A1c was 11.9% on May 11, 2018.) An ACE inhibitor/angiotensin II receptor blocker is not being taken. She does not see a podiatrist.Eye exam is not current.      Review of Systems  Constitutional: Negative for chills, fever and unexpected weight change.  HENT: Negative for trouble swallowing and voice change.   Eyes: Negative for visual disturbance.  Respiratory: Negative for cough, shortness of breath and wheezing.   Cardiovascular: Negative for chest pain, palpitations and leg swelling.  Gastrointestinal: Negative for diarrhea, nausea and vomiting.  Endocrine: Negative for cold intolerance, heat intolerance, polydipsia, polyphagia and polyuria.  Musculoskeletal: Negative for arthralgias and myalgias.  Skin: Negative for color change, pallor, rash and wound.  Neurological: Negative for seizures and headaches.  Psychiatric/Behavioral: Negative  for confusion and suicidal ideas.    Objective:    BP 125/80   Pulse 86   Wt 229 lb (103.9 kg)   BMI 36.96 kg/m   Wt Readings from Last 3 Encounters:  06/27/18 229 lb (103.9 kg)  06/03/17 241 lb (109.3 kg)  04/28/17 239 lb 9.6 oz (108.7 kg)     Physical Exam  Constitutional: She is oriented to person, place, and time. She appears well-developed.  HENT:  Head: Normocephalic and atraumatic.  Eyes: EOM are normal.  Neck: Normal range of motion. Neck supple. No tracheal deviation present. No thyromegaly present.  Cardiovascular: Normal rate and regular rhythm.  Pulmonary/Chest: Effort normal and breath sounds normal.  Abdominal: Soft. Bowel sounds are normal. There is no tenderness. There is no guarding.  Musculoskeletal: Normal range of motion. She exhibits no edema.  Neurological: She is alert and oriented to person, place, and time. She has normal reflexes. No cranial nerve deficit. Coordination  normal.  Skin: Skin is warm and dry. No rash noted. No erythema. No pallor.  Positive for acanthosis nigricans.  Psychiatric: She has a normal mood and affect. Judgment normal.      CMP ( most recent) CMP     Component Value Date/Time   NA 143 03/22/2017 1659   K 4.3 03/22/2017 1659   CL 105 03/22/2017 1659   CO2 24 03/22/2017 1659   GLUCOSE 67 (L) 03/22/2017 1659   BUN 8 05/11/2018   CREATININE 0.7 05/11/2018   CREATININE 0.61 03/22/2017 1659   CALCIUM 9.7 03/22/2017 1659   PROT 7.3 12/15/2016 1033   ALBUMIN 3.8 12/15/2016 1033   AST 12 12/15/2016 1033   ALT 10 12/15/2016 1033   ALKPHOS 69 12/15/2016 1033   BILITOT 0.3 12/15/2016 1033   GFRNONAA >60 07/04/2015 1220   GFRAA >60 07/04/2015 1220     Diabetic Labs (most recent): Lab Results  Component Value Date   HGBA1C 11.9 05/11/2018   HGBA1C 6.7 (H) 03/22/2017   HGBA1C 8.5 (H) 12/15/2016     Lipid Panel ( most recent) Lipid Panel     Component Value Date/Time   CHOL 184 (H) 12/15/2016 1033   TRIG 130 (H)  12/15/2016 1033   HDL 44 (L) 12/15/2016 1033   CHOLHDL 4.2 12/15/2016 1033   VLDL 26 12/15/2016 1033   LDLCALC 114 (H) 12/15/2016 1033      Lab Results  Component Value Date   TSH 1.77 05/11/2018   TSH 1.373 07/07/2015           Assessment & Plan:   1. Uncontrolled type 2 diabetes mellitus with hyperglycemia ([place])   - Raffaella Edison has currently uncontrolled symptomatic type 2 DM since 22 years of age,  with most recent A1c of 11.9 %. Recent labs reviewed.  -her diabetes is complicated by obesity/sedentary life and Toni Demo remains at a high risk for more acute and chronic complications which include CAD, CVA, CKD, retinopathy, and neuropathy. These are all discussed in detail with the patient.  - I have counseled her on diet management and weight loss, by adopting a carbohydrate restricted/protein rich diet.  - Suggestion is made for her to avoid simple carbohydrates  from her diet including Cakes, Sweet Desserts, Ice Cream, Soda (diet and regular), Sweet Tea, Candies, Chips, Cookies, Store Bought Juices, Alcohol in Excess of  1-2 drinks a day, Artificial Sweeteners, and "Sugar-free" Products. This will help patient to have stable blood glucose profile and potentially avoid unintended weight gain.  - I encouraged her to switch to  unprocessed or minimally processed complex starch and increased protein intake (animal or plant source), fruits, and vegetables.  - she is advised to stick to a routine mealtimes to eat 3 meals  a day and avoid unnecessary snacks ( to snack only to correct hypoglycemia).   - she will be scheduled with Jearld Fenton, RDN, CDE for individualized diabetes education.  - I have approached her with the following individualized plan to manage diabetes and patient agrees:   -Given her current and prevailing glycemic burden, she will likely require insulin treatment in order for her to achieve control of diabetes to target.   -In preparation, I  approach her to start monitoring blood glucose strictly 4 times a day- before meals and at bedtime and return in 1 week with her meter and logs.    -Patient is encouraged to call clinic for blood glucose levels less than 70 or above 300 mg /  dl. - I will continue Janumet 50/1000 mg p.o. twice daily, therapeutically suitable for patient . - I will discontinue glipizide, risk outweighs benefit for this patient.  - Patient specific target  A1c;  LDL, HDL, Triglycerides, and  Waist Circumference were discussed in detail.  2) BP/HTN: Her blood pressure is controlled to target.  She is not on any antihypertensive medications.  3) Lipids/HPL:   She has dyslipidemia with significantly above target LDL of 114.  She may benefit from early initiation of statin therapy.  She will be considered for low-dose Crestor on next visit.  4)  Weight/Diet: CDE Consult will be initiated , exercise, and detailed carbohydrates information provided.  5) Chronic Care/Health Maintenance:  -she  is encouraged to initiate and continue to follow up with Ophthalmology, Dentist,  Podiatrist at least yearly or according to recommendations, and advised to  stay away from smoking. I have recommended yearly flu vaccine and pneumonia vaccine at least every 5 years; moderate intensity exercise for up to 150 minutes weekly; and  sleep for at least 7 hours a day.  - I advised patient to maintain close follow up with Alycia Rossetti, MD for primary care needs.  - Time spent with the patient: 45 minutes, of which >50% was spent in obtaining information about her symptoms, reviewing her previous labs, evaluations, and treatments, counseling her about her currently uncontrolled type 2 diabetes, hyperlipidemia, obesity, and developing developing  plans for long term treatment based on the latest recommendations.  Yetta Glassman participated in the discussions, expressed understanding, and voiced agreement with the above plans.  All  questions were answered to her satisfaction. she is encouraged to contact clinic should she have any questions or concerns prior to her return visit.  Follow up plan: - Return in about 1 week (around 07/04/2018) for follow up with meter and logs- no labs.  Glade Lloyd, MD [facility] [address] [place], [place] [address] Phone: [phone]  Fax: [phone]    06/27/2018, 6:40 PM  This note was partially dictated with voice recognition software. Similar sounding words can be transcribed inadequately or may not  be corrected upon review.

## 2018-06-29 ENCOUNTER — Other Ambulatory Visit: Payer: Self-pay

## 2018-06-29 MED ORDER — GLUCOSE BLOOD VI STRP: ORAL_STRIP | 2 refills | Status: AC

## 2018-06-29 MED ORDER — ACCU-CHEK GUIDE W/DEVICE KIT: 1.0000 | PACK | Freq: Four times a day (QID) | 0 refills | Status: AC

## 2018-07-10 ENCOUNTER — Ambulatory Visit: Payer: PRIVATE HEALTH INSURANCE | Admitting: "Endocrinology

## 2018-07-19 ENCOUNTER — Encounter: Payer: Self-pay | Admitting: "Endocrinology

## 2018-07-19 ENCOUNTER — Ambulatory Visit (INDEPENDENT_AMBULATORY_CARE_PROVIDER_SITE_OTHER): Payer: PRIVATE HEALTH INSURANCE | Admitting: "Endocrinology

## 2018-07-19 VITALS — BP 126/85 | HR 87 | Ht 66.0 in | Wt 232.0 lb

## 2018-07-19 DIAGNOSIS — Z91199 Patient's noncompliance with other medical treatment and regimen due to unspecified reason: Secondary | ICD-10-CM

## 2018-07-19 DIAGNOSIS — Z9119 Patient's noncompliance with other medical treatment and regimen: Secondary | ICD-10-CM

## 2018-07-19 DIAGNOSIS — E782 Mixed hyperlipidemia: Secondary | ICD-10-CM

## 2018-07-19 DIAGNOSIS — E1165 Type 2 diabetes mellitus with hyperglycemia: Secondary | ICD-10-CM

## 2018-07-19 DIAGNOSIS — Z6836 Body mass index (BMI) 36.0-36.9, adult: Secondary | ICD-10-CM

## 2018-07-19 DIAGNOSIS — E6609 Other obesity due to excess calories: Secondary | ICD-10-CM

## 2018-07-19 DIAGNOSIS — I10 Essential (primary) hypertension: Secondary | ICD-10-CM | POA: Insufficient documentation

## 2018-07-19 NOTE — Patient Instructions (Signed)

## 2018-07-19 NOTE — Progress Notes (Signed)
Endocrinology follow-up note       07/19/2018, 4:43 PM   Subjective:    Patient ID: Maria Aguilar, female    DOB: 10-Mar-1996.  Erza Mothershead is being seen in follow-up for management of currently uncontrolled symptomatic diabetes requested by  Alycia Rossetti, MD.   Past Medical History:  Diagnosis Date  . Diabetes mellitus without complication [facility])    Past Surgical History:  Procedure Laterality Date  . WISDOM TOOTH EXTRACTION     Social History   Socioeconomic History  . Marital status: Single    Spouse name: Not on file  . Number of children: Not on file  . Years of education: Not on file  . Highest education level: Not on file  Occupational History  . Not on file  Social Needs  . Financial resource strain: Not on file  . Food insecurity:    Worry: Not on file    Inability: Not on file  . Transportation needs:    Medical: Not on file    Non-medical: Not on file  Tobacco Use  . Smoking status: Never Smoker  . Smokeless tobacco: Never Used  Substance and Sexual Activity  . Alcohol use: No  . Drug use: No  . Sexual activity: Not Currently    Birth control/protection: Pill  Lifestyle  . Physical activity:    Days per week: Not on file    Minutes per session: Not on file  . Stress: Not on file  Relationships  . Social connections:    Talks on phone: Not on file    Gets together: Not on file    Attends religious service: Not on file    Active member of club or organization: Not on file    Attends meetings of clubs or organizations: Not on file    Relationship status: Not on file  Other Topics Concern  . Not on file  Social History Narrative  . Not on file   Outpatient Encounter Medications as of 07/19/2018  Medication Sig  . Alcohol Swabs (ALCOHOL PADS) 70 % PADS Dispense based on patient and insurance preference. Use to monitor FSBS 3x daily. Dx: E11.65.  . blood  glucose meter kit and supplies Dispense based on patient and insurance preference. Use to monitor FSBS 3x daily. Dx: E11.65.  Marland Kitchen Blood Glucose Monitoring Suppl (ACCU-CHEK GUIDE) w/Device KIT 1 Piece by Does not apply route 4 (four) times daily.  Marland Kitchen glucose blood (ACCU-CHEK GUIDE) test strip Use as instructed qid. E11.65  . Lancets MISC Dispense based on patient and insurance preference. Use to monitor FSBS 3x daily. Dx: E11.65.  . norethindrone-ethinyl estradiol-iron (MICROGESTIN FE,GILDESS FE,LOESTRIN FE) 1.5-30 MG-MCG tablet Take 1 tablet by mouth at bedtime.  . sitaGLIPtin-metformin (JANUMET) 50-1000 MG tablet Take 1 tablet by mouth 2 (two) times daily with a meal.   No facility-administered encounter medications on file as of 07/19/2018.     ALLERGIES: No Known Allergies  VACCINATION STATUS: Immunization History  Administered Date(s) Administered  . DTaP 04/17/1997, 06/14/1997, 08/21/1997, 11/20/1998  . Hepatitis B [birth year], 1997, 04/17/1997, 08/21/1997  . HiB (PRP-OMP) 04/17/1997, 06/14/1997, 11/20/1998  . IPV 04/17/1997,  06/14/1997, 11/20/1998  . MMR 11/20/1998, 05/26/2016  . Meningococcal B, OMV 02/24/2016, 05/26/2016  . Meningococcal Conjugate 02/24/2016  . Meningococcal Polysaccharide 05/20/2008  . Pneumococcal Polysaccharide-23 09/26/2015  . Td 05/04/2008  . Tdap 05/04/2008    Diabetes  She presents for her follow-up diabetic visit. She has type 2 diabetes mellitus. Onset time: She was diagnosed at approximate age of 49 years. Her disease course has been worsening. There are no hypoglycemic associated symptoms. Pertinent negatives for hypoglycemia include no confusion, headaches, pallor or seizures. Associated symptoms include polydipsia and polyuria. Pertinent negatives for diabetes include no chest pain and no polyphagia. There are no hypoglycemic complications. Symptoms are worsening. Risk factors for coronary artery disease include diabetes mellitus, dyslipidemia, obesity and  sedentary lifestyle. Current diabetic treatment includes oral agent (dual therapy). Her weight is fluctuating minimally. She is following a generally unhealthy diet. When asked about meal planning, she reported none. She has had a previous visit with a dietitian. She rarely participates in exercise. Her home blood glucose trend is increasing steadily. Her overall blood glucose range is >200 mg/dl. (She brings an incomplete blood glucose readings and did not follow instructions properly.  Her average blood glucose is greater than 250 mg/dL, her most recent A1c was 11.9% on May 11, 2018.) An ACE inhibitor/angiotensin II receptor blocker is not being taken. She does not see a podiatrist.Eye exam is not current.  Hyperlipidemia  This is a chronic problem. The problem is uncontrolled. Exacerbating diseases include diabetes and obesity. Pertinent negatives include no chest pain, myalgias or shortness of breath. She is currently on no antihyperlipidemic treatment. Risk factors for coronary artery disease include dyslipidemia, diabetes mellitus, obesity and a sedentary lifestyle.    Review of Systems  Constitutional: Negative for chills, fever and unexpected weight change.  HENT: Negative for trouble swallowing and voice change.   Eyes: Negative for visual disturbance.  Respiratory: Negative for cough, shortness of breath and wheezing.   Cardiovascular: Negative for chest pain, palpitations and leg swelling.  Gastrointestinal: Negative for diarrhea, nausea and vomiting.  Endocrine: Positive for polydipsia and polyuria. Negative for cold intolerance, heat intolerance and polyphagia.  Musculoskeletal: Negative for arthralgias and myalgias.  Skin: Negative for color change, pallor, rash and wound.  Neurological: Negative for seizures and headaches.  Psychiatric/Behavioral: Negative for confusion and suicidal ideas.    Objective:    BP 126/85   Pulse 87   Ht 5' 6"  (1.676 m)   Wt 232 lb (105.2 kg)   BMI  37.45 kg/m   Wt Readings from Last 3 Encounters:  07/19/18 232 lb (105.2 kg)  06/27/18 229 lb (103.9 kg)  06/03/17 241 lb (109.3 kg)     Physical Exam  Constitutional: She is oriented to person, place, and time. She appears well-developed.  HENT:  Head: Normocephalic and atraumatic.  Eyes: EOM are normal.  Neck: Normal range of motion. Neck supple. No tracheal deviation present. No thyromegaly present.  Cardiovascular: Normal rate and regular rhythm.  Pulmonary/Chest: Effort normal and breath sounds normal.  Abdominal: Soft. Bowel sounds are normal. There is no tenderness. There is no guarding.  Musculoskeletal: Normal range of motion. She exhibits no edema.  Neurological: She is alert and oriented to person, place, and time. She has normal reflexes. No cranial nerve deficit. Coordination normal.  Skin: Skin is warm and dry. No rash noted. No erythema. No pallor.  Positive for acanthosis nigricans.  Psychiatric: She has a normal mood and affect. Judgment normal.    CMP (  most recent) CMP     Component Value Date/Time   NA 143 03/22/2017 1659   K 4.3 03/22/2017 1659   CL 105 03/22/2017 1659   CO2 24 03/22/2017 1659   GLUCOSE 67 (L) 03/22/2017 1659   BUN 8 05/11/2018   CREATININE 0.7 05/11/2018   CREATININE 0.61 03/22/2017 1659   CALCIUM 9.7 03/22/2017 1659   PROT 7.3 12/15/2016 1033   ALBUMIN 3.8 12/15/2016 1033   AST 12 12/15/2016 1033   ALT 10 12/15/2016 1033   ALKPHOS 69 12/15/2016 1033   BILITOT 0.3 12/15/2016 1033   GFRNONAA >60 07/04/2015 1220   GFRAA >60 07/04/2015 1220     Diabetic Labs (most recent): Lab Results  Component Value Date   HGBA1C 11.9 05/11/2018   HGBA1C 6.7 (H) 03/22/2017   HGBA1C 8.5 (H) 12/15/2016     Lipid Panel ( most recent) Lipid Panel     Component Value Date/Time   CHOL 184 (H) 12/15/2016 1033   TRIG 130 (H) 12/15/2016 1033   HDL 44 (L) 12/15/2016 1033   CHOLHDL 4.2 12/15/2016 1033   VLDL 26 12/15/2016 1033   LDLCALC 114  (H) 12/15/2016 1033      Lab Results  Component Value Date   TSH 1.77 05/11/2018   TSH 1.373 07/07/2015      Assessment & Plan:   1. Uncontrolled type 2 diabetes mellitus with hyperglycemia ([place])  - Lizvet Chunn has currently uncontrolled symptomatic type 2 DM since 22 years of age. -She did not bring complete records, did not monitor 4 times a day.  Her average blood glucose still greater than 250 mg/dL, recent A1c of 11.9%.  -her diabetes is complicated by obesity/sedentary life and Sammie Schermerhorn remains at a high risk for more acute and chronic complications which include CAD, CVA, CKD, retinopathy, and neuropathy. These are all discussed in detail with the patient.  - I have counseled her on diet management and weight loss, by adopting a carbohydrate restricted/protein rich diet.  -  Suggestion is made for her to avoid simple carbohydrates  from her diet including Cakes, Sweet Desserts / Pastries, Ice Cream, Soda (diet and regular), Sweet Tea, Candies, Chips, Cookies, Store Bought Juices, Alcohol in Excess of  1-2 drinks a day, Artificial Sweeteners, and "Sugar-free" Products. This will help patient to have stable blood glucose profile and potentially avoid unintended weight gain.  - I encouraged her to switch to  unprocessed or minimally processed complex starch and increased protein intake (animal or plant source), fruits, and vegetables.  - she is advised to stick to a routine mealtimes to eat 3 meals  a day and avoid unnecessary snacks ( to snack only to correct hypoglycemia).   - she will be scheduled with Jearld Fenton, RDN, CDE for individualized diabetes education.  - I have approached her with the following individualized plan to manage diabetes and patient agrees:   -Given her current and prevailing glycemic burden, she is an immediate candidate for at least basal insulin, however she declines this offer at this time.   -She promises to do better on her diet and  will see the dietitian. -I advised her to continue Janumet 50/1000 mg p.o. twice daily, therapeutically acceptable for patient. -She agrees to continue to test blood glucose one time daily before breakfast. -Patient is encouraged to call clinic for blood glucose levels less than 70 or above 300 mg /dl.  - Patient specific target  A1c;  LDL, HDL, Triglycerides, and  Waist Circumference  were discussed in detail.  2) BP/HTN: Her blood pressure is controlled to target.  She is not on any antihypertensive medications.  3) Lipids/HPL:   She has dyslipidemia with significantly above target LDL of 114.  She may benefit from early initiation of statin therapy.  She will be considered for low-dose Crestor on next visit.  4)  Weight/Diet: CDE Consult has been initiated , exercise, and detailed carbohydrates information provided.  5) Chronic Care/Health Maintenance:  -she  is encouraged to initiate and continue to follow up with Ophthalmology, Dentist,  Podiatrist at least yearly or according to recommendations, and advised to  stay away from smoking. I have recommended yearly flu vaccine and pneumonia vaccine at least every 5 years; moderate intensity exercise for up to 150 minutes weekly; and  sleep for at least 7 hours a day.  - I advised patient to maintain close follow up with Alycia Rossetti, MD for primary care needs.  - Time spent with the patient: 25 min, of which >50% was spent in reviewing her blood glucose logs , discussing her hypo- and hyper-glycemic episodes, reviewing her current and  previous labs and insulin doses and developing a plan to avoid hypo- and hyper-glycemia. Please refer to Patient Instructions for Blood Glucose Monitoring and Insulin/Medications Dosing Guide"  in media tab for additional information. Yetta Glassman participated in the discussions, expressed understanding, and voiced agreement with the above plans.  All questions were answered to her satisfaction. she is  encouraged to contact clinic should she have any questions or concerns prior to her return visit.   Follow up plan: - Return in about 5 weeks (around 08/23/2018) for Meter, and Logs.  Glade Lloyd, MD [facility] [address] [place], [place] [address] Phone: [phone]  Fax: [phone]    07/19/2018, 4:43 PM  This note was partially dictated with voice recognition software. Similar sounding words can be transcribed inadequately or may not  be corrected upon review.

## 2018-08-21 ENCOUNTER — Encounter (HOSPITAL_COMMUNITY): Payer: Self-pay | Admitting: Emergency Medicine

## 2018-08-21 ENCOUNTER — Other Ambulatory Visit: Payer: Self-pay

## 2018-08-21 ENCOUNTER — Emergency Department (HOSPITAL_COMMUNITY)
Admission: EM | Admit: 2018-08-21 | Discharge: 2018-08-21 | Disposition: A | Discharge status: Home / Self Care | Payer: PRIVATE HEALTH INSURANCE | Attending: Emergency Medicine | Admitting: Emergency Medicine

## 2018-08-21 DIAGNOSIS — R739 Hyperglycemia, unspecified: Secondary | ICD-10-CM | POA: Insufficient documentation

## 2018-08-21 DIAGNOSIS — Z79899 Other long term (current) drug therapy: Secondary | ICD-10-CM | POA: Diagnosis not present

## 2018-08-21 DIAGNOSIS — E119 Type 2 diabetes mellitus without complications: Secondary | ICD-10-CM | POA: Insufficient documentation

## 2018-08-21 DIAGNOSIS — I10 Essential (primary) hypertension: Secondary | ICD-10-CM | POA: Insufficient documentation

## 2018-08-21 LAB — BASIC METABOLIC PANEL
Anion gap: 14 (ref 5–15)
BUN: 8 mg/dL (ref 6–20)
CO2: 21 mmol/L — AB (ref 22–32)
CREATININE: 0.59 mg/dL (ref 0.44–1.00)
Calcium: 9.5 mg/dL (ref 8.9–10.3)
Chloride: 99 mmol/L (ref 98–111)
GFR calc non Af Amer: >60 mL/min (ref >60)
Glucose, Bld: 331 mg/dL — ABNORMAL HIGH (ref 70–99)
Potassium: 3.8 mmol/L (ref 3.5–5.1)
Sodium: 134 mmol/L — ABNORMAL LOW (ref 135–145)

## 2018-08-21 LAB — CBC
HCT: 40.7 % (ref 36.0–46.0)
Hemoglobin: 13.3 g/dL (ref 12.0–15.0)
MCH: 26.6 pg (ref 26.0–34.0)
MCHC: 32.7 g/dL (ref 30.0–36.0)
MCV: 81.4 fL (ref 78.0–100.0)
PLATELETS: 404 10*3/uL — AB (ref 150–400)
RBC: 5 MIL/uL (ref 3.87–5.11)
RDW: 11.2 % — AB (ref 11.5–15.5)
WBC: 12.2 10*3/uL — AB (ref 4.0–10.5)

## 2018-08-21 LAB — URINALYSIS, ROUTINE W REFLEX MICROSCOPIC
Bilirubin Urine: NEGATIVE
HGB URINE DIPSTICK: NEGATIVE
Ketones, ur: 20 mg/dL — AB
LEUKOCYTES UA: NEGATIVE
NITRITE: NEGATIVE
PH: 5 (ref 5.0–8.0)
Protein, ur: >=300 mg/dL — AB
SPECIFIC GRAVITY, URINE: 1.042 — AB (ref 1.005–1.030)

## 2018-08-21 LAB — I-STAT BETA HCG BLOOD, ED (MC, WL, AP ONLY): I-stat hCG, quantitative: <5 m[IU]/mL (ref <5)

## 2018-08-21 LAB — CBG MONITORING, ED
GLUCOSE-CAPILLARY: 306 mg/dL — AB (ref 70–99)
Glucose-Capillary: 293 mg/dL — ABNORMAL HIGH (ref 70–99)

## 2018-08-21 MED ORDER — INSULIN ASPART 100 UNIT/ML ~~LOC~~ SOLN
6.0000 [IU] | Freq: Once | SUBCUTANEOUS | Status: AC
  Administered 2018-08-21: 6 [IU] via SUBCUTANEOUS
  Filled 2018-08-21: qty 1

## 2018-08-21 NOTE — ED Triage Notes (Signed)
Pt has T2 DM, reports her CBG was 404 when she last checked today around 12. Pt reports the last few days her CBG has been around 400. Pt reports she as not been eating regularly due to loss of appetite. Pt denies N/V, abdominal pain.

## 2018-08-21 NOTE — ED Notes (Signed)
Patient verbalizes understanding of discharge instructions. Opportunity for questioning and answers were provided. Armband removed by staff, pt discharged from ED ambualtory.  

## 2018-08-21 NOTE — Discharge Instructions (Addendum)
Please read attached information. If you experience any new or worsening signs or symptoms please return to the emergency room for evaluation. Please follow-up with your primary care provider or specialist as discussed.  °

## 2018-08-21 NOTE — ED Provider Notes (Signed)
[facility] EMERGENCY DEPARTMENT Provider Note   CSN: [id number] Arrival date & time: 08/21/18  1537     History   Chief Complaint Chief Complaint  Patient presents with  . Hyperglycemia    HPI Maria Aguilar is a 22 y.o. female.  HPI   22 year old female presents today with complaints of hyperglycemia.  She has a history of type 2 diabetes, she was previously on insulin but was taken off and kept on metformin/glipizide.  She notes that recently her diet has been poor and she is a Electronics engineer, with high glycemic foods.  She notes her blood sugar readings have been in the mid upper 300 range and 400 today.  She notes increased thirst and urination, denies any abdominal pain fever chills nausea or vomiting.  Patient was supposed to have a follow-up appointment on Wednesday with her endocrinologist but did not follow-up with her labs.     Past Medical History:  Diagnosis Date  . Diabetes mellitus without complication [facility]     Patient Active Problem List   Diagnosis Date Noted  . Essential hypertension, benign 07/19/2018  . Personal history of noncompliance with medical treatment, presenting hazards to health 07/19/2018  . Mixed hyperlipidemia 07/19/2018  . Obesity 07/07/2015  . Uncontrolled type 2 diabetes mellitus with hyperglycemia ([place]) 07/07/2015    Past Surgical History:  Procedure Laterality Date  . WISDOM TOOTH EXTRACTION       OB History    Gravida      Para      Term      Preterm      AB      Living  0     SAB      TAB      Ectopic      Multiple      Live Births               Home Medications    Prior to Admission medications   Medication Sig Start Date End Date Taking? Authorizing Provider  Alcohol Swabs (ALCOHOL PADS) 70 % PADS Dispense based on patient and insurance preference. Use to monitor FSBS 3x daily. Dx: E11.65. 07/07/15   Alycia Rossetti, MD  blood glucose meter kit and supplies Dispense based on  patient and insurance preference. Use to monitor FSBS 3x daily. Dx: E11.65. 07/07/15   Alycia Rossetti, MD  Blood Glucose Monitoring Suppl (ACCU-CHEK GUIDE) w/Device KIT 1 Piece by Does not apply route 4 (four) times daily. 06/29/18   Cassandria Anger, MD  glucose blood (ACCU-CHEK GUIDE) test strip Use as instructed qid. E11.65 06/29/18   Cassandria Anger, MD  Lancets MISC Dispense based on patient and insurance preference. Use to monitor FSBS 3x daily. Dx: E11.65. 07/07/15   Alycia Rossetti, MD  norethindrone-ethinyl estradiol-iron (MICROGESTIN FE,GILDESS FE,LOESTRIN FE) 1.5-30 MG-MCG tablet Take 1 tablet by mouth at bedtime.    [provider]  sitaGLIPtin-metformin (JANUMET) 50-1000 MG tablet Take 1 tablet by mouth 2 (two) times daily with a meal. 05/03/17   Roberta, Modena Nunnery, MD    Family History Family History  Problem Relation Age of Onset  . Hypertension Mother   . Diabetes Mother        boarderline diabetic  . Diabetes Paternal Grandmother   . Diabetes Paternal Grandfather     Social History Social History   Tobacco Use  . Smoking status: Never Smoker  . Smokeless tobacco: Never Used  Substance Use Topics  .  Alcohol use: No  . Drug use: No     Allergies   Patient has no known allergies.   Review of Systems Review of Systems  All other systems reviewed and are negative.    Physical Exam Updated Vital Signs BP 122/79 (BP Location: Right Arm)   Pulse 85   Temp 98.4 F (36.9 C)   Resp 12   Ht 5' 6"  (1.676 m)   Wt 104.3 kg   SpO2 99%   BMI 37.12 kg/m   Physical Exam  Constitutional: She is oriented to person, place, and time. She appears well-developed and well-nourished.  HENT:  Head: Normocephalic and atraumatic.  Eyes: Pupils are equal, round, and reactive to light. Conjunctivae are normal. Right eye exhibits no discharge. Left eye exhibits no discharge. No scleral icterus.  Neck: Normal range of motion. No JVD present. No tracheal  deviation present.  Pulmonary/Chest: Effort normal. No stridor.  Neurological: She is alert and oriented to person, place, and time. Coordination normal.  Psychiatric: She has a normal mood and affect. Her behavior is normal. Judgment and thought content normal.  Nursing note and vitals reviewed.    ED Treatments / Results  Labs (all labs ordered are listed, but only abnormal results are displayed) Labs Reviewed  BASIC METABOLIC PANEL - Abnormal; Notable for the following components:      Result Value   Sodium 134 (*)    CO2 21 (*)    Glucose, Bld 331 (*)    All other components within normal limits  CBC - Abnormal; Notable for the following components:   WBC 12.2 (*)    RDW 11.2 (*)    Platelets 404 (*)    All other components within normal limits  URINALYSIS, ROUTINE W REFLEX MICROSCOPIC - Abnormal; Notable for the following components:   APPearance HAZY (*)    Specific Gravity, Urine 1.042 (*)    Glucose, UA >=500 (*)    Ketones, ur 20 (*)    Protein, ur >=300 (*)    Bacteria, UA RARE (*)    All other components within normal limits  CBG MONITORING, ED - Abnormal; Notable for the following components:   Glucose-Capillary 306 (*)    All other components within normal limits  CBG MONITORING, ED - Abnormal; Notable for the following components:   Glucose-Capillary 293 (*)    All other components within normal limits  HEMOGLOBIN A1C  I-STAT BETA HCG BLOOD, ED ([facility], [facility], [facility] ONLY)    EKG None  Radiology No results found.  Procedures Procedures (including critical care time)  Medications Ordered in ED Medications  insulin aspart (novoLOG) injection 6 Units (6 Units Subcutaneous Given 08/21/18 2121)     Initial Impression / Assessment and Plan / ED Course  I have reviewed the triage vital signs and the nursing notes.  Pertinent labs & imaging results that were available during my care of the patient were reviewed by me and considered in my medical decision making  (see chart for details).     22 year old female presents today with hyperglycemia.  Patient reports she is taking her medication as directed but has a very poor diet.  She does not exercise and is eating foods high in sugar.  This is likely a strong determining factor to her elevated blood glucose level.  Patient has no signs of DKA, she is in no acute distress or discomfort.  She was given a dose of insulin which only minorly brought her blood sugar down.  Patient is dehydrated, she was given fluids to drink here.  Patient does not want any further medical management in this setting, I do find this completely reasonable.  Patient will continue monitoring blood sugar, I did place an A1c order so that she may follow-up as an outpatient with her endocrinologist.  Patient is given strict return precautions, she was extensively counseled on dietary and lifestyle management of diabetes.  She is given strict return precautions, she verbalized understanding and agreement to today's plan had no further questions or concerns.  Final Clinical Impressions(s) / ED Diagnoses   Final diagnoses:  Hyperglycemia    ED Discharge Orders    None       Francee Gentile 08/22/18 1350    Mesner, Corene Cornea, MD 08/22/18 2121

## 2018-08-23 ENCOUNTER — Ambulatory Visit: Payer: PRIVATE HEALTH INSURANCE | Admitting: "Endocrinology

## 2018-08-23 ENCOUNTER — Ambulatory Visit: Payer: PRIVATE HEALTH INSURANCE | Admitting: Nutrition

## 2018-08-23 LAB — HEMOGLOBIN A1C
Hgb A1c MFr Bld: 9.4 % — ABNORMAL HIGH (ref 4.8–5.6)
Mean Plasma Glucose: 223 mg/dL

## 2018-09-14 ENCOUNTER — Ambulatory Visit: Payer: PRIVATE HEALTH INSURANCE | Admitting: "Endocrinology

## 2018-09-14 ENCOUNTER — Encounter: Payer: Self-pay | Admitting: "Endocrinology

## 2019-01-26 ENCOUNTER — Ambulatory Visit: Payer: PRIVATE HEALTH INSURANCE | Admitting: Family Medicine

## 2019-01-26 ENCOUNTER — Encounter: Payer: Self-pay | Admitting: Family Medicine

## 2019-01-26 ENCOUNTER — Other Ambulatory Visit: Payer: Self-pay

## 2019-01-26 VITALS — BP 112/64 | HR 80 | Temp 98.6°F | Resp 14 | Ht 66.0 in | Wt 210.0 lb

## 2019-01-26 DIAGNOSIS — F4323 Adjustment disorder with mixed anxiety and depressed mood: Secondary | ICD-10-CM | POA: Diagnosis not present

## 2019-01-26 DIAGNOSIS — E782 Mixed hyperlipidemia: Secondary | ICD-10-CM | POA: Diagnosis not present

## 2019-01-26 DIAGNOSIS — B3731 Acute candidiasis of vulva and vagina: Secondary | ICD-10-CM

## 2019-01-26 DIAGNOSIS — B373 Candidiasis of vulva and vagina: Secondary | ICD-10-CM | POA: Diagnosis not present

## 2019-01-26 DIAGNOSIS — E6609 Other obesity due to excess calories: Secondary | ICD-10-CM

## 2019-01-26 DIAGNOSIS — E1165 Type 2 diabetes mellitus with hyperglycemia: Secondary | ICD-10-CM | POA: Diagnosis not present

## 2019-01-26 DIAGNOSIS — Z6833 Body mass index (BMI) 33.0-33.9, adult: Secondary | ICD-10-CM

## 2019-01-26 LAB — URINALYSIS, ROUTINE W REFLEX MICROSCOPIC
Bilirubin Urine: NEGATIVE
LEUKOCYTE UA: NEGATIVE
NITRITE: NEGATIVE
Specific Gravity, Urine: 1.025 (ref 1.001–1.03)
WBC, UA: NONE SEEN /HPF (ref 0–5)
pH: 5.5 (ref 5.0–8.0)

## 2019-01-26 LAB — HEMOGLOBIN A1C, FINGERSTICK: Hgb A1C (fingerstick): 11.9 % OF TOTAL HGB — ABNORMAL HIGH (ref <6.0)

## 2019-01-26 LAB — MICROSCOPIC MESSAGE

## 2019-01-26 MED ORDER — FLUCONAZOLE 150 MG PO TABS: ORAL_TABLET | ORAL | 1 refills | Status: DC

## 2019-01-26 MED ORDER — FLUOXETINE HCL 10 MG PO TABS: 10.0000 mg | ORAL_TABLET | Freq: Every day | ORAL | 3 refills | Status: DC

## 2019-01-26 MED ORDER — SITAGLIPTIN PHOS-METFORMIN HCL 50-1000 MG PO TABS: 1.0000 | ORAL_TABLET | Freq: Two times a day (BID) | ORAL | 6 refills | Status: DC
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
## 2019-01-26 NOTE — Assessment & Plan Note (Signed)
Uncontrolled DM, A1C in office 11.9%, non compliant with medications which she admits,also with significant home stressors. Treat depression/anxiety symptoms She did try to take her home Janumet 50-1000mg  the dose it too high therefore causing nausea Will give samples of Janumet 50-500mg  BID for 2 weeks, then increase back to her 50-1000mg  BID Check blood sugar fasting every morning Will try to simplify regimen Return to Office in 4 weeks with meter

## 2019-01-26 NOTE — Assessment & Plan Note (Signed)
Start prozac 10mg  once a day  She has had a few therapy sessions, states she is ready to proceed with individual therapy, this is through her fathers work- will call the EAP and set up

## 2019-01-26 NOTE — Assessment & Plan Note (Signed)
Check lipids, expect TG to be elevated in setting of uncontrolled Diabetes in general

## 2019-01-26 NOTE — Patient Instructions (Addendum)
Call for the EAP from your fathers work for therapy Diflucan for yeast infection Start prozac once a day , you can take morning or evening but be consistent Start janumet 50-500 twice a day for 2 weeks, then go back to your regular dose of  50-1000mg  twice a day  F/U 4 weeks

## 2019-01-27 LAB — CBC WITH DIFFERENTIAL/PLATELET
Absolute Monocytes: 613 cells/uL (ref 200–950)
BASOS ABS: 42 {cells}/uL (ref 0–200)
Basophils Relative: 0.5 %
EOS ABS: 143 {cells}/uL (ref 15–500)
Eosinophils Relative: 1.7 %
HCT: 38.7 % (ref 35.0–45.0)
HEMOGLOBIN: 12.8 g/dL (ref 11.7–15.5)
Lymphs Abs: 2352 cells/uL (ref 850–3900)
MCH: 26.8 pg — AB (ref 27.0–33.0)
MCHC: 33.1 g/dL (ref 32.0–36.0)
MCV: 81.1 fL (ref 80.0–100.0)
MONOS PCT: 7.3 %
MPV: 10.4 fL (ref 7.5–12.5)
NEUTROS ABS: 5250 {cells}/uL (ref 1500–7800)
NEUTROS PCT: 62.5 %
Platelets: 359 10*3/uL (ref 140–400)
RBC: 4.77 10*6/uL (ref 3.80–5.10)
RDW: 11.7 % (ref 11.0–15.0)
Total Lymphocyte: 28 %
WBC: 8.4 10*3/uL (ref 3.8–10.8)

## 2019-01-27 LAB — LIPID PANEL
CHOL/HDL RATIO: 4 (calc) (ref <5.0)
CHOLESTEROL: 170 mg/dL (ref <200)
HDL: 43 mg/dL — AB (ref >=50)
LDL CHOLESTEROL (CALC): 106 mg/dL — AB
Non-HDL Cholesterol (Calc): 127 mg/dL (calc) (ref <130)
TRIGLYCERIDES: 112 mg/dL (ref <150)

## 2019-01-27 LAB — COMPREHENSIVE METABOLIC PANEL
AG Ratio: 1.4 (calc) (ref 1.0–2.5)
ALT: 8 U/L (ref 6–29)
AST: 10 U/L (ref 10–30)
Albumin: 4.5 g/dL (ref 3.6–5.1)
Alkaline phosphatase (APISO): 94 U/L (ref 31–125)
BILIRUBIN TOTAL: 0.3 mg/dL (ref 0.2–1.2)
BUN: 9 mg/dL (ref 7–25)
CALCIUM: 9.9 mg/dL (ref 8.6–10.2)
CO2: 25 mmol/L (ref 20–32)
Chloride: 100 mmol/L (ref 98–110)
Creat: 0.62 mg/dL (ref 0.50–1.10)
GLUCOSE: 325 mg/dL — AB (ref 65–99)
Globulin: 3.2 g/dL (calc) (ref 1.9–3.7)
Potassium: 4.5 mmol/L (ref 3.5–5.3)
Sodium: 134 mmol/L — ABNORMAL LOW (ref 135–146)
Total Protein: 7.7 g/dL (ref 6.1–8.1)

## 2019-01-27 LAB — MICROALBUMIN / CREATININE URINE RATIO
Creatinine, Urine: 94 mg/dL (ref 20–275)
Microalb Creat Ratio: 50 mcg/mg creat — ABNORMAL HIGH (ref <30)
Microalb, Ur: 4.7 mg/dL

## 2019-01-29 ENCOUNTER — Other Ambulatory Visit: Payer: Self-pay | Admitting: *Deleted

## 2019-01-29 MED ORDER — LISINOPRIL 2.5 MG PO TABS: 2.5000 mg | ORAL_TABLET | Freq: Every day | ORAL | 3 refills | Status: DC

## 2019-02-27 ENCOUNTER — Ambulatory Visit: Payer: PRIVATE HEALTH INSURANCE | Admitting: Family Medicine

## 2019-06-13 LAB — HM DIABETES EYE EXAM

## 2019-09-05 DIAGNOSIS — Z1159 Encounter for screening for other viral diseases: Secondary | ICD-10-CM | POA: Diagnosis not present

## 2019-09-24 ENCOUNTER — Telehealth: Payer: Self-pay | Admitting: Family Medicine

## 2019-09-24 NOTE — Telephone Encounter (Signed)
Patient called in states she was tested for Covid on 09/05/2019 at [facility] in [place]. Her test was positive she now needs a note to return to work. She states that she isn't having any symptoms at this time. She states that her parents were tested 5 days later and they were positive as well.  CB# [phone]

## 2019-09-24 NOTE — Telephone Encounter (Signed)
Per office policy, patient requires telephone visit with provider prior to work note.   Appointment scheduled.

## 2019-09-25 ENCOUNTER — Other Ambulatory Visit: Payer: PRIVATE HEALTH INSURANCE

## 2019-09-25 ENCOUNTER — Encounter: Payer: Self-pay | Admitting: Family Medicine

## 2019-09-25 ENCOUNTER — Other Ambulatory Visit: Payer: Self-pay

## 2019-09-25 ENCOUNTER — Ambulatory Visit (INDEPENDENT_AMBULATORY_CARE_PROVIDER_SITE_OTHER): Payer: BLUE CROSS/BLUE SHIELD | Admitting: Family Medicine

## 2019-09-25 DIAGNOSIS — Z8616 Personal history of COVID-19: Secondary | ICD-10-CM

## 2019-09-25 DIAGNOSIS — E1165 Type 2 diabetes mellitus with hyperglycemia: Secondary | ICD-10-CM | POA: Diagnosis not present

## 2019-09-25 DIAGNOSIS — Z8619 Personal history of other infectious and parasitic diseases: Secondary | ICD-10-CM | POA: Diagnosis not present

## 2019-09-25 DIAGNOSIS — N926 Irregular menstruation, unspecified: Secondary | ICD-10-CM

## 2019-09-25 LAB — URINALYSIS, ROUTINE W REFLEX MICROSCOPIC
Bilirubin Urine: NEGATIVE
Hyaline Cast: NONE SEEN /LPF
Leukocytes,Ua: NEGATIVE
Nitrite: NEGATIVE
Specific Gravity, Urine: 1.02 (ref 1.001–1.03)
pH: 5.5 (ref 5.0–8.0)

## 2019-09-25 LAB — MICROSCOPIC MESSAGE

## 2019-09-25 LAB — PREGNANCY, URINE: Preg Test, Ur: NEGATIVE

## 2019-09-25 NOTE — Progress Notes (Signed)
Virtual Visit via Telephone Note  I connected with Maria Aguilar on 09/25/19 at  2:15 PM EDT by telephone and verified that I am speaking with the correct person using two identifiers.     Pt location: at home   Physician location:  In office, [facility], Milinda Antis MD     On call: patient and physician ;  I discussed the limitations, risks, security and privacy concerns of performing an evaluation and management service by telephone and the availability of in person appointments. I also discussed with the patient that there may be a patient responsible charge related to this service. The patient expressed understanding and agreed to proceed.   History of Present Illness:   She went [place] at end of sept, when she returned she had cough and fatigue Symptoms started on Monday  28th had Covid swab Sept 30th was positive.  This was done at [facility] in [place], quarantine for the appropriate amount of time however her parents had a positive test on October 7 with symptoms therefore she quarantine additionally with them. Not had any symptoms in greater than 1 week she has not had any fever cough congestion she is not taking any fever reducing medications.  Of note she works for [employer] in [place].  She had been driving back and forth but especially in the middle of moving to [place].   DM- not on any meds at all she states that the Metformin made her sick so she stopped taking it.  Her last A1c was uncontrolled at 11.9% in February She is not taking her lisinopril and she also stopped the Prozac.   LMP- Beginning of Sept/ menses has been irregular   she has had episodes where she wakes up and will have vomiting in the middle the night.  States it is random.  She denies any chronic abdominal pain.  He denies any recent sexual activity does not think that she could be pregnant.    Observations/Objective: NAD noted on phone   Assessment and Plan: History of  COVID-19 infection she is quarantine for the appropriate amount of time for both her own positive testing her parents she may return to work as she is asymptomatic at this time.  Diabetes mellitus uncontrolled she has not been on any medications.  She is not had good control of her diabetes since the beginning.  I will have her come in this afternoon if she is coming to get her work note and get her blood drawn.  She has not made any appointments as she is being traveling back and forth between her parents home in [place] and she is planning to transition to [employer] full-time.  We will leave get her labs drawn today see what her A1c renal function looks like and get her on some medications for her diabetes.  I am concerned that the vomiting episodes are related to her blood sugar not necessarily the.  However we will check a urine pregnancy test because of her irregular menses as well as urinalysis  Follow Up Instructions:    I discussed the assessment and treatment plan with the patient. The patient was provided an opportunity to ask questions and all were answered. The patient agreed with the plan and demonstrated an understanding of the instructions.   The patient was advised to call back or seek an in-person evaluation if the symptoms worsen or if the condition fails to improve as anticipated.  I provided 13 minutes  of non-face-to-face time during this encounter. End  2:28  Vic Blackbird, MD

## 2019-09-26 ENCOUNTER — Other Ambulatory Visit: Payer: Self-pay | Admitting: *Deleted

## 2019-09-26 LAB — CBC WITH DIFFERENTIAL/PLATELET
Absolute Monocytes: 594 cells/uL (ref 200–950)
Basophils Absolute: 54 cells/uL (ref 0–200)
Basophils Relative: 0.5 %
Eosinophils Absolute: 108 cells/uL (ref 15–500)
Eosinophils Relative: 1 %
HCT: 42.6 % (ref 35.0–45.0)
Hemoglobin: 13.5 g/dL (ref 11.7–15.5)
Lymphs Abs: 4126 cells/uL — ABNORMAL HIGH (ref 850–3900)
MCH: 25.8 pg — ABNORMAL LOW (ref 27.0–33.0)
MCHC: 31.7 g/dL — ABNORMAL LOW (ref 32.0–36.0)
MCV: 81.3 fL (ref 80.0–100.0)
MPV: 10.6 fL (ref 7.5–12.5)
Monocytes Relative: 5.5 %
Neutro Abs: 5918 cells/uL (ref 1500–7800)
Neutrophils Relative %: 54.8 %
Platelets: 393 10*3/uL (ref 140–400)
RBC: 5.24 10*6/uL — ABNORMAL HIGH (ref 3.80–5.10)
RDW: 11.6 % (ref 11.0–15.0)
Total Lymphocyte: 38.2 %
WBC: 10.8 10*3/uL (ref 3.8–10.8)

## 2019-09-26 LAB — COMPREHENSIVE METABOLIC PANEL
AG Ratio: 1.4 (calc) (ref 1.0–2.5)
ALT: 10 U/L (ref 6–29)
AST: 7 U/L — ABNORMAL LOW (ref 10–30)
Albumin: 4.5 g/dL (ref 3.6–5.1)
Alkaline phosphatase (APISO): 88 U/L (ref 31–125)
BUN: 7 mg/dL (ref 7–25)
CO2: 24 mmol/L (ref 20–32)
Calcium: 9.9 mg/dL (ref 8.6–10.2)
Chloride: 100 mmol/L (ref 98–110)
Creat: 0.68 mg/dL (ref 0.50–1.10)
Globulin: 3.3 g/dL (calc) (ref 1.9–3.7)
Glucose, Bld: 396 mg/dL — ABNORMAL HIGH (ref 65–99)
Potassium: 4.2 mmol/L (ref 3.5–5.3)
Sodium: 136 mmol/L (ref 135–146)
Total Bilirubin: 0.4 mg/dL (ref 0.2–1.2)
Total Protein: 7.8 g/dL (ref 6.1–8.1)

## 2019-09-26 LAB — HEMOGLOBIN A1C
Hgb A1c MFr Bld: 13.3 % of total Hgb — ABNORMAL HIGH (ref <5.7)
Mean Plasma Glucose: 335 (calc)
eAG (mmol/L): 18.6 (calc)

## 2019-09-26 LAB — FSH/LH
FSH: 3.5 m[IU]/mL
LH: 4.1 m[IU]/mL

## 2019-09-26 LAB — MICROALBUMIN / CREATININE URINE RATIO
Creatinine, Urine: 106 mg/dL (ref 20–275)
Microalb Creat Ratio: 102 mcg/mg creat — ABNORMAL HIGH (ref <30)
Microalb, Ur: 10.8 mg/dL

## 2019-09-26 LAB — TSH: TSH: 1.13 mIU/L

## 2019-09-26 MED ORDER — METFORMIN HCL 500 MG PO TABS: 500.0000 mg | ORAL_TABLET | Freq: Every day | ORAL | 3 refills | Status: DC

## 2019-09-26 MED ORDER — FLUCONAZOLE 150 MG PO TABS: 150.0000 mg | ORAL_TABLET | Freq: Once | ORAL | 0 refills | Status: AC

## 2019-09-26 MED ORDER — BD PEN NEEDLE NANO 2ND GEN 32G X 4 MM MISC: 1 refills | Status: DC

## 2019-09-26 MED ORDER — LANTUS SOLOSTAR 100 UNIT/ML ~~LOC~~ SOPN: 10.0000 [IU] | PEN_INJECTOR | Freq: Every day | SUBCUTANEOUS | 11 refills | Status: DC

## 2019-10-08 ENCOUNTER — Other Ambulatory Visit: Payer: Self-pay

## 2019-10-09 ENCOUNTER — Encounter: Payer: Self-pay | Admitting: Family Medicine

## 2019-10-09 ENCOUNTER — Ambulatory Visit (INDEPENDENT_AMBULATORY_CARE_PROVIDER_SITE_OTHER): Payer: BLUE CROSS/BLUE SHIELD | Admitting: Family Medicine

## 2019-10-09 DIAGNOSIS — E1165 Type 2 diabetes mellitus with hyperglycemia: Secondary | ICD-10-CM | POA: Diagnosis not present

## 2019-10-09 MED ORDER — LANTUS SOLOSTAR 100 UNIT/ML ~~LOC~~ SOPN: 12.0000 [IU] | PEN_INJECTOR | Freq: Every day | SUBCUTANEOUS | 11 refills | Status: DC

## 2019-10-09 NOTE — Assessment & Plan Note (Signed)
Diabetes mellitus uncontrolled at this time we will try to simplify her regimen.  She will continue the metformin 500 mg once a day she is tolerating this without any difficulty she initially had diarrhea but that has resolved.  Increase her Lantus to 12 units since she works third shift she will take her Lantus in the mornings to make this consistent.  We will continue to titrate up her insulin as needed.  We will give her a few more weeks and then see if she can tolerate the Metformin twice a day.  I am not can restart the ACE inhibitor at this time again try to simplify her regimen so that she can be compliant.  She is going to continue checking her blood sugar at least twice a day.

## 2019-10-09 NOTE — Patient Instructions (Addendum)
Increase lantus to 12 units once a day- okay to take in the morning Take metformin with 1 meal breakfast or dinner  F/U 2 months for Physical with PAP Smear/ Labs

## 2019-10-09 NOTE — Progress Notes (Signed)
   Subjective:    Patient ID: Maria Aguilar, female    DOB: [birth year], 23 y.o.   MRN: [id number]  Patient presents for Diabetes (follow up) and Form Completion (STDisability)  Pt has forms to complete  for STD being out with covid for > 1 month   She had COIVd infection see previous note , positive on 9/30. Her family members also picked up [place]  So she has not been able to return to work until this Thursday based on protocol.  Her symptoms have completely resolved.   DM- recent A1C 13.3, currently on Metformin 500mg   And Lantus  10 units, CBG after eating today was 315  Fasting still above  200   LMP last week he did not have the nausea episodes like she had before.  She is currently moving to [place] she is actually packed up to move today but she plans to come to our office to continue her care for now.     Review Of Systems:  GEN- denies fatigue, fever, weight loss,weakness, r/ecent illness HEENT- denies eye drainage, change in vision, nasal discharge, CVS- denies chest pain, palpitations RESP- denies SOB, cough, wheeze ABD- denies N/V, change in stools, abd pain GU- denies dysuria, hematuria, dribbling, incontinence MSK- denies joint pain, muscle aches, injury Neuro- denies headache, dizziness, syncope, seizure activity       Objective:    BP 104/60 (BP Location: Left Arm, Patient Position: Sitting, Cuff Size: Normal)   Pulse 76   Temp 99.1 F (37.3 C) (Oral)   Resp 18   Ht 5\' 8"  (1.727 m)   Wt 213 lb 3.2 oz (96.7 kg)   SpO2 99%   BMI 32.42 kg/m  GEN- NAD, alert and oriented x3 HEENT- PERRL, EOMI, non injected sclera, pink conjunctiva, MMM, oropharynx clear Neck- Supple, no thyromegaly CVS- RRR, no murmur RESP-CTAB ABD-NABS,soft,NT,ND EXT- No edema Pulses- Radial, DP- 2+        Assessment & Plan:    Forms completed for short-term disability in the setting of previous COVID-19 infection she is cleared to return to work with no restrictions. Problem  List Items Addressed This Visit      Unprioritized   Uncontrolled type 2 diabetes mellitus with hyperglycemia ([place])    Diabetes mellitus uncontrolled at this time we will try to simplify her regimen.  She will continue the metformin 500 mg once a day she is tolerating this without any difficulty she initially had diarrhea but that has resolved.  Increase her Lantus to 12 units since she works third shift she will take her Lantus in the mornings to make this consistent.  We will continue to titrate up her insulin as needed.  We will give her a few more weeks and then see if she can tolerate the Metformin twice a day.  I am not can restart the ACE inhibitor at this time again try to simplify her regimen so that she can be compliant.  She is going to continue checking her blood sugar at least twice a day.      Relevant Medications   Insulin Glargine (LANTUS SOLOSTAR) 100 UNIT/ML Solostar Pen   Other Relevant Orders   HM DIABETES FOOT EXAM (Completed)      Note: This dictation was prepared with Dragon dictation along with smaller phrase technology. Any transcriptional errors that result from this process are unintentional.

## 2019-10-19 ENCOUNTER — Encounter: Payer: Self-pay | Admitting: *Deleted

## 2019-10-29 ENCOUNTER — Telehealth: Payer: Self-pay | Admitting: *Deleted

## 2019-10-29 NOTE — Telephone Encounter (Signed)
Call placed to patient. LMTRC.  

## 2019-10-29 NOTE — Telephone Encounter (Signed)
-----   Message from Alycia Rossetti, MD sent at 10/29/2019  3:30 PM EST ----- Regarding: FW: F/U blood sugars Call and see what BS are running, what meds she is taking  ----- Message ----- From: Alycia Rossetti, MD Sent: 10/29/2019 To: Alycia Rossetti, MD Subject: F/U blood sugars

## 2019-10-30 NOTE — Telephone Encounter (Signed)
Call placed to patient. LMTRC.  

## 2019-10-31 NOTE — Telephone Encounter (Signed)
noted 

## 2019-10-31 NOTE — Telephone Encounter (Signed)
Multiple calls placed to patient with no answer and no return call.   Message to be closed.  

## 2019-12-11 ENCOUNTER — Encounter: Payer: BLUE CROSS/BLUE SHIELD | Admitting: Family Medicine

## 2020-03-18 DIAGNOSIS — Z20828 Contact with and (suspected) exposure to other viral communicable diseases: Secondary | ICD-10-CM | POA: Diagnosis not present

## 2020-03-18 DIAGNOSIS — Z03818 Encounter for observation for suspected exposure to other biological agents ruled out: Secondary | ICD-10-CM | POA: Diagnosis not present

## 2020-04-01 DIAGNOSIS — Z20828 Contact with and (suspected) exposure to other viral communicable diseases: Secondary | ICD-10-CM | POA: Diagnosis not present

## 2020-04-01 DIAGNOSIS — Z03818 Encounter for observation for suspected exposure to other biological agents ruled out: Secondary | ICD-10-CM | POA: Diagnosis not present

## 2020-05-02 DIAGNOSIS — L02416 Cutaneous abscess of left lower limb: Secondary | ICD-10-CM | POA: Diagnosis not present

## 2020-05-02 DIAGNOSIS — Z7689 Persons encountering health services in other specified circumstances: Secondary | ICD-10-CM | POA: Diagnosis not present

## 2020-05-04 DIAGNOSIS — Z4800 Encounter for change or removal of nonsurgical wound dressing: Secondary | ICD-10-CM | POA: Diagnosis not present

## 2020-05-09 ENCOUNTER — Other Ambulatory Visit: Payer: Self-pay

## 2020-05-09 ENCOUNTER — Encounter: Payer: Self-pay | Admitting: Family Medicine

## 2020-05-09 ENCOUNTER — Ambulatory Visit: Payer: BC Managed Care – PPO | Admitting: Family Medicine

## 2020-05-09 VITALS — BP 102/62 | HR 100 | Temp 98.1°F | Resp 16 | Ht 68.0 in | Wt 191.0 lb

## 2020-05-09 DIAGNOSIS — E782 Mixed hyperlipidemia: Secondary | ICD-10-CM | POA: Diagnosis not present

## 2020-05-09 DIAGNOSIS — E1165 Type 2 diabetes mellitus with hyperglycemia: Secondary | ICD-10-CM | POA: Diagnosis not present

## 2020-05-09 DIAGNOSIS — L02416 Cutaneous abscess of left lower limb: Secondary | ICD-10-CM | POA: Diagnosis not present

## 2020-05-09 DIAGNOSIS — N943 Premenstrual tension syndrome: Secondary | ICD-10-CM

## 2020-05-09 LAB — PREGNANCY, URINE: Preg Test, Ur: NEGATIVE

## 2020-05-09 MED ORDER — NORETHINDRONE ACET-ETHINYL EST 1-20 MG-MCG PO TABS: 1.0000 | ORAL_TABLET | Freq: Every day | ORAL | 11 refills | Status: DC

## 2020-05-09 MED ORDER — DOXYCYCLINE HYCLATE 100 MG PO TABS: 100.0000 mg | ORAL_TABLET | Freq: Two times a day (BID) | ORAL | 0 refills | Status: DC

## 2020-05-09 MED ORDER — HYDROCODONE-ACETAMINOPHEN 5-325 MG PO TABS: 1.0000 | ORAL_TABLET | Freq: Four times a day (QID) | ORAL | 0 refills | Status: DC

## 2020-05-09 MED ORDER — FLUCONAZOLE 150 MG PO TABS: ORAL_TABLET | ORAL | 0 refills | Status: DC

## 2020-05-09 NOTE — Assessment & Plan Note (Signed)
Uncontrolled diabetes mellitus affecting is contributing to the slow healing of the abscess.  We will recheck her labs today and will restart her on medications.  She has not checked her blood sugars.  Abscess/cellulitis of left leg status post I&D in the office but no significant pus expressed.  We will change her to doxycycline as it was worsening on the Augmentin.  If she does not have any improvement using this plus sitz bath and warm compresses we will get her to a general surgeon.  CBC will be checked today.  SHe is not having any systemic symptoms of infection.

## 2020-05-09 NOTE — Assessment & Plan Note (Signed)
Urine pregnancy test was negative.  After she completes her antibiotics and on the first day of her next.  Recommend she start Loestrin. Required a lower dose of estrogen due to the mood swings.

## 2020-05-09 NOTE — Patient Instructions (Addendum)
Take the doxycycline twice a day for 10 days Stop the augmentin Remove the packing on Sunday Continue epson salt soaks If not improved you will be referred to surgeon We will call with lab results  New birth control sent, you can start first day of your next period  F/U 3 months

## 2020-05-09 NOTE — Progress Notes (Signed)
Subjective:    Patient ID: Maria Aguilar, female    DOB: [birth year], 24 y.o.   MRN: [id number]  Patient presents for Abscess (back or R thigh- had I&D at [facility] for absces and ABTx given- new area started) PT here for abscess of her left inner leg.  She was actually seen at the urgent care to go.  She states that a 25.  Large boil popped up.  She had a lot of tenderness and discomfort so she went to the local urgent care.  They did an incision and drainage and start her on Bactrim.  She actually went back a few days later for recheck if there was not improving she states that she was switched to Augmentin and was told that the Bactrim would not cover it.  She has not had any fever or chills but she does have pain and now has a new spot that has come up.  She has been taking hydrocodone that was prescribed by urgent care as well.  I also gave her Diflucan but she has not filled this yet.  Uncontrolled diabetes mellitus her last visit was in November 2020 at that time her A1c was 13%.  She is not taking any current medications for her diabetes.   She states she will be moving back to the area   Mother present today   [place] , Jennette  Courtney fusell  NP   Also want medication that she can use to help with her premenstrual cycle symptoms.  She gets severe nausea vomiting the first 24 hours of her.  And some heavy bleeding.  She was on birth control in the past but had mood swings with it so she stopped it.  She would like to try something he can have would not cause weight gain.  Review Of Systems:  GEN- denies fatigue, fever, weight loss,weakness, recent illness HEENT- denies eye drainage, change in vision, nasal discharge, CVS- denies chest pain, palpitations RESP- denies SOB, cough, wheeze ABD- denies N/V, change in stools, abd pain GU- denies dysuria, hematuria, dribbling, incontinence MSK-+ joint pain, muscle aches, injury Neuro- denies headache, dizziness, syncope,  seizure activity       Objective:    BP 102/62   Pulse 100   Temp 98.1 F (36.7 C) (Temporal)   Resp 16   Ht 5\' 8"  (1.727 m)   Wt 191 lb (86.6 kg)   SpO2 99%   BMI 29.04 kg/m  GEN- NAD, alert and oriented x3 HEENT- PERRL, EOMI, non injected sclera, pink conjunctiva, MMM, oropharynx clear Neck- Supple, no thyromegaly CVS- RRR, no murmur RESP-CTAB ABD-NABS,soft,NT,ND Skin- left inner leg below buttocks crease 2 moder quarter size area of swelling with fluctuance on upper lesion, +induration, erythema, tracking down 1-2 inches , TTP  EXT- No edema Pulses- Radial, DP- 2+  Procedure- Incision and Drainage Procedure explained to patient questions answered benefits and risks discussed written consent obtained. Antiseptic-Betadine Anesthesia-lidocaine 1%  Incision performed blood expressed minimal swelling  1inch of iodoform packing placed  Minimal blood loss Patient tolerated procedure well Bandage applied       Assessment & Plan:      Problem List Items Addressed This Visit      Unprioritized   Mixed hyperlipidemia   Relevant Orders   Lipid panel   PMS (premenstrual syndrome)    Urine pregnancy test was negative.  After she completes her antibiotics and on the first day of her next.  Recommend she start  Loestrin. Required a lower dose of estrogen due to the mood swings.      Relevant Orders   Pregnancy, urine (Completed)   Uncontrolled type 2 diabetes mellitus with hyperglycemia (HCC) - Primary    Uncontrolled diabetes mellitus affecting is contributing to the slow healing of the abscess.  We will recheck her labs today and will restart her on medications.  She has not checked her blood sugars.  Abscess/cellulitis of left leg status post I&D in the office but no significant pus expressed.  We will change her to doxycycline as it was worsening on the Augmentin.  If she does not have any improvement using this plus sitz bath and warm compresses we will get her to a  general surgeon.  CBC will be checked today.  SHe is not having any systemic symptoms of infection.      Relevant Orders   CBC with Differential/Platelet   Comprehensive metabolic panel   Hemoglobin A1c   Lipid panel   Microalbumin / creatinine urine ratio    Other Visit Diagnoses    Abscess of left leg          Note: This dictation was prepared with Dragon dictation along with smaller phrase technology. Any transcriptional errors that result from this process are unintentional.

## 2020-05-10 LAB — COMPREHENSIVE METABOLIC PANEL
AG Ratio: 1.1 (calc) (ref 1.0–2.5)
ALT: 12 U/L (ref 6–29)
AST: 15 U/L (ref 10–30)
Albumin: 4.3 g/dL (ref 3.6–5.1)
Alkaline phosphatase (APISO): 93 U/L (ref 31–125)
BUN: 10 mg/dL (ref 7–25)
CO2: 30 mmol/L (ref 20–32)
Calcium: 9.6 mg/dL (ref 8.6–10.2)
Chloride: 98 mmol/L (ref 98–110)
Creat: 0.65 mg/dL (ref 0.50–1.10)
Globulin: 3.8 g/dL (calc) — ABNORMAL HIGH (ref 1.9–3.7)
Glucose, Bld: 263 mg/dL — ABNORMAL HIGH (ref 65–99)
Potassium: 4.8 mmol/L (ref 3.5–5.3)
Sodium: 136 mmol/L (ref 135–146)
Total Bilirubin: 0.3 mg/dL (ref 0.2–1.2)
Total Protein: 8.1 g/dL (ref 6.1–8.1)

## 2020-05-10 LAB — LIPID PANEL
Cholesterol: 187 mg/dL (ref <200)
HDL: 38 mg/dL — ABNORMAL LOW (ref >=50)
LDL Cholesterol (Calc): 111 mg/dL (calc) — ABNORMAL HIGH
Non-HDL Cholesterol (Calc): 149 mg/dL (calc) — ABNORMAL HIGH (ref <130)
Total CHOL/HDL Ratio: 4.9 (calc) (ref <5.0)
Triglycerides: 267 mg/dL — ABNORMAL HIGH (ref <150)

## 2020-05-10 LAB — CBC WITH DIFFERENTIAL/PLATELET
Absolute Monocytes: 572 cells/uL (ref 200–950)
Basophils Absolute: 39 cells/uL (ref 0–200)
Basophils Relative: 0.3 %
Eosinophils Absolute: 104 cells/uL (ref 15–500)
Eosinophils Relative: 0.8 %
HCT: 38.5 % (ref 35.0–45.0)
Hemoglobin: 12.5 g/dL (ref 11.7–15.5)
Lymphs Abs: 3718 cells/uL (ref 850–3900)
MCH: 27.1 pg (ref 27.0–33.0)
MCHC: 32.5 g/dL (ref 32.0–36.0)
MCV: 83.3 fL (ref 80.0–100.0)
MPV: 9.6 fL (ref 7.5–12.5)
Monocytes Relative: 4.4 %
Neutro Abs: 8567 cells/uL — ABNORMAL HIGH (ref 1500–7800)
Neutrophils Relative %: 65.9 %
Platelets: 415 10*3/uL — ABNORMAL HIGH (ref 140–400)
RBC: 4.62 10*6/uL (ref 3.80–5.10)
RDW: 11.5 % (ref 11.0–15.0)
Total Lymphocyte: 28.6 %
WBC: 13 10*3/uL — ABNORMAL HIGH (ref 3.8–10.8)

## 2020-05-10 LAB — HEMOGLOBIN A1C
Hgb A1c MFr Bld: 10 % of total Hgb — ABNORMAL HIGH (ref <5.7)
Mean Plasma Glucose: 240 (calc)
eAG (mmol/L): 13.3 (calc)

## 2020-05-10 LAB — MICROALBUMIN / CREATININE URINE RATIO
Creatinine, Urine: 357 mg/dL — ABNORMAL HIGH (ref 20–275)
Microalb Creat Ratio: 29 mcg/mg creat (ref <30)
Microalb, Ur: 10.5 mg/dL

## 2020-05-12 ENCOUNTER — Other Ambulatory Visit: Payer: Self-pay | Admitting: *Deleted

## 2020-05-12 MED ORDER — OZEMPIC (0.25 OR 0.5 MG/DOSE) 2 MG/1.5ML ~~LOC~~ SOPN: 0.2500 mg | PEN_INJECTOR | SUBCUTANEOUS | 3 refills | Status: DC

## 2020-08-12 ENCOUNTER — Ambulatory Visit: Payer: BC Managed Care – PPO | Admitting: Family Medicine

## 2020-12-05 DIAGNOSIS — E119 Type 2 diabetes mellitus without complications: Secondary | ICD-10-CM | POA: Diagnosis not present

## 2020-12-07 DIAGNOSIS — Z03818 Encounter for observation for suspected exposure to other biological agents ruled out: Secondary | ICD-10-CM | POA: Diagnosis not present

## 2020-12-07 DIAGNOSIS — Z20822 Contact with and (suspected) exposure to covid-19: Secondary | ICD-10-CM | POA: Diagnosis not present

## 2021-01-26 ENCOUNTER — Other Ambulatory Visit: Payer: Self-pay | Admitting: Family Medicine

## 2021-02-03 ENCOUNTER — Other Ambulatory Visit: Payer: Self-pay | Admitting: *Deleted

## 2021-02-03 MED ORDER — FLUCONAZOLE 150 MG PO TABS: ORAL_TABLET | ORAL | 0 refills | Status: DC

## 2021-03-04 ENCOUNTER — Other Ambulatory Visit: Payer: Self-pay | Admitting: Family Medicine

## 2021-03-04 NOTE — Telephone Encounter (Signed)
Patient called to request courtesy refill of   MICROGESTIN 1-20 MG-MCG tablet [[id number]]   Patient takes her pill at night and has 2 pills left. Patient aware she needs to find another pcp.   Pharmacy:  [facility] [address], [place] - [address]  [address] [place], [place] [address]  Phone:  [phone] Fax:  [phone]   Please advise at [phone].

## 2021-04-01 DIAGNOSIS — E785 Hyperlipidemia, unspecified: Secondary | ICD-10-CM | POA: Diagnosis not present

## 2021-04-01 DIAGNOSIS — F3281 Premenstrual dysphoric disorder: Secondary | ICD-10-CM | POA: Diagnosis not present

## 2021-04-01 DIAGNOSIS — E1165 Type 2 diabetes mellitus with hyperglycemia: Secondary | ICD-10-CM | POA: Diagnosis not present

## 2021-04-01 DIAGNOSIS — F419 Anxiety disorder, unspecified: Secondary | ICD-10-CM | POA: Diagnosis not present

## 2021-04-24 DIAGNOSIS — E785 Hyperlipidemia, unspecified: Secondary | ICD-10-CM | POA: Diagnosis not present

## 2021-04-24 DIAGNOSIS — Z130 Encounter for screening for diseases of the blood and blood-forming organs and certain disorders involving the immune mechanism: Secondary | ICD-10-CM | POA: Diagnosis not present

## 2021-04-24 DIAGNOSIS — F3281 Premenstrual dysphoric disorder: Secondary | ICD-10-CM | POA: Diagnosis not present

## 2021-04-24 DIAGNOSIS — E1165 Type 2 diabetes mellitus with hyperglycemia: Secondary | ICD-10-CM | POA: Diagnosis not present

## 2021-04-29 DIAGNOSIS — Z0001 Encounter for general adult medical examination with abnormal findings: Secondary | ICD-10-CM | POA: Diagnosis not present

## 2021-04-29 DIAGNOSIS — F321 Major depressive disorder, single episode, moderate: Secondary | ICD-10-CM | POA: Diagnosis not present

## 2021-04-29 DIAGNOSIS — E1165 Type 2 diabetes mellitus with hyperglycemia: Secondary | ICD-10-CM | POA: Diagnosis not present

## 2021-04-29 DIAGNOSIS — E785 Hyperlipidemia, unspecified: Secondary | ICD-10-CM | POA: Diagnosis not present

## 2021-05-20 DIAGNOSIS — L02415 Cutaneous abscess of right lower limb: Secondary | ICD-10-CM | POA: Diagnosis not present

## 2021-05-20 DIAGNOSIS — E11628 Type 2 diabetes mellitus with other skin complications: Secondary | ICD-10-CM | POA: Diagnosis not present

## 2021-06-12 ENCOUNTER — Other Ambulatory Visit: Payer: Self-pay

## 2021-06-12 ENCOUNTER — Encounter: Payer: Self-pay | Admitting: Women's Health

## 2021-06-12 ENCOUNTER — Ambulatory Visit (INDEPENDENT_AMBULATORY_CARE_PROVIDER_SITE_OTHER): Payer: BC Managed Care – PPO | Admitting: Women's Health

## 2021-06-12 ENCOUNTER — Other Ambulatory Visit (HOSPITAL_COMMUNITY)
Admission: RE | Admit: 2021-06-12 | Discharge: 2021-06-12 | Disposition: A | Discharge status: Home / Self Care | Payer: BC Managed Care – PPO | Source: Ambulatory Visit | Attending: Obstetrics & Gynecology | Admitting: Obstetrics & Gynecology

## 2021-06-12 VITALS — BP 129/81 | HR 103 | Ht 66.0 in | Wt 197.8 lb

## 2021-06-12 DIAGNOSIS — L0293 Carbuncle, unspecified: Secondary | ICD-10-CM

## 2021-06-12 DIAGNOSIS — Z124 Encounter for screening for malignant neoplasm of cervix: Secondary | ICD-10-CM

## 2021-06-12 DIAGNOSIS — Z01419 Encounter for gynecological examination (general) (routine) without abnormal findings: Secondary | ICD-10-CM | POA: Insufficient documentation

## 2021-06-12 DIAGNOSIS — Z30011 Encounter for initial prescription of contraceptive pills: Secondary | ICD-10-CM

## 2021-06-12 DIAGNOSIS — B373 Candidiasis of vulva and vagina: Secondary | ICD-10-CM

## 2021-06-12 DIAGNOSIS — B3731 Acute candidiasis of vulva and vagina: Secondary | ICD-10-CM

## 2021-06-12 MED ORDER — FLUCONAZOLE 100 MG PO TABS: 100.0000 mg | ORAL_TABLET | Freq: Every day | ORAL | 0 refills | Status: DC

## 2021-06-12 MED ORDER — NORETHINDRONE ACET-ETHINYL EST 1-20 MG-MCG PO TABS: 1.0000 | ORAL_TABLET | Freq: Every day | ORAL | 3 refills | Status: DC

## 2021-06-12 NOTE — Progress Notes (Signed)
GYN VISIT-Breast exam & pap only Patient name: Maria Aguilar MRN [id number]  Date of birth: [birth year] Chief Complaint:   Gynecologic Exam (Pap smear/Cyst on groin)  History of Present Illness:   Maria Aguilar is a 25 y.o. G0 African-American female being seen today for breast exam and pap only (physical done by PCP).    No LMP recorded. (Menstrual status: Oral contraceptives). The current method of family planning is OCP (estrogen/progesterone) x yrs- started pills d/t long periods. Hasn't had period in years while on COCs. Recurrent boil Rt groin, usually flares at least once/yr. Just finished antibiotics about 1-2wks ago, still a little tender. Has DM, hasn't been well controlled, trying to get better about managing it.  Last pap reports ~29yrs ago w/ [facility]. Results were:  neg per her report  Depression screen [facility] 2/9 06/12/2021 01/26/2019 07/19/2018 06/03/2017 03/22/2017  Decreased Interest 0 2 0 0 0  Down, Depressed, Hopeless 0 2 0 0 0  PHQ - 2 Score 0 4 0 0 0  Altered sleeping 3 3 - 0 0  Tired, decreased energy 1 3 - 0 0  Change in appetite 1 3 - 0 0  Feeling bad or failure about yourself  0 1 - 0 0  Trouble concentrating 2 0 - 0 0  Moving slowly or fidgety/restless 0 0 - 0 0  Suicidal thoughts 0 0 - 0 0  PHQ-9 Score 7 14 - 0 0  Difficult doing work/chores - Very difficult - Not difficult at all Not difficult at all     GAD 7 : Generalized Anxiety Score 06/12/2021  Nervous, Anxious, on Edge 0  Control/stop worrying 0  Worry too much - different things 0  Trouble relaxing 2  Restless 0  Easily annoyed or irritable 0  Afraid - awful might happen 0  Total GAD 7 Score 2     Review of Systems:   Pertinent items are noted in HPI Denies fever/chills, dizziness, headaches, visual disturbances, fatigue, shortness of breath, chest pain, abdominal pain, vomiting, abnormal vaginal discharge/itching/odor/irritation, problems with periods, bowel movements, urination, or intercourse unless  otherwise stated above.  Pertinent History Reviewed:  Reviewed past medical,surgical, social, obstetrical and family history.  Reviewed problem list, medications and allergies. Physical Assessment:   Vitals:   06/12/21 1303  BP: 129/81  Pulse: (!) 103  Weight: 197 lb 12.8 oz (89.7 kg)  Height: 5\' 6"  (1.676 m)  Body mass index is 31.93 kg/m.       Physical Examination:   General appearance: alert, well appearing, and in no distress  Mental status: alert, oriented to person, place, and time  Skin: warm & dry   Cardiovascular: normal heart rate noted  Respiratory: normal respiratory effort, no distress  Abdomen: soft, non-tender   Pelvic: vulva w/ white/moist appearance c/w yeast, thick very white area at perineum- reports occ itchy but not bad. Indurated boil Rt inner thigh- slightly tender, no erythema. Co-exam w/ LHE, chronic yeast appearance of vulva- recommends painting w/ gentian violet (done) and diflucan w/ f/u. Chronic inflammation/induration of boil- recommends returning when active for removal  Extremities: no edema   Chaperone: Latisha Cresenzo    No results found for this or any previous visit (from the past 24 hour(s)).  Assessment & Plan:  1) Pap & breast exam  2) DM w/ chronic vulvar candida> painted w/ gentian violet, rx diflucan 100mg  daily x 10d, f/u to assess mainly the white spot at perineum  3) Recurrent boil>  w/ induration/chronic inflammation, likely buried ingrown hair, per LHE call back when active again, will excise  4) Contraception management> refilled COCs she is currently on and doing well w/  Meds:  Meds ordered this encounter  Medications   norethindrone-ethinyl estradiol (MICROGESTIN) 1-20 MG-MCG tablet    Sig: Take 1 tablet by mouth daily.    Dispense:  90 tablet    Refill:  3    Order Specific Question:   Supervising Provider    Answer:   Duane Lope H [[id number]]   fluconazole (DIFLUCAN) 100 MG tablet    Sig: Take 1 tablet (100 mg  total) by mouth daily. X 10 days    Dispense:  10 tablet    Refill:  0    Order Specific Question:   Supervising Provider    Answer:   Despina Hidden, LUTHER H [[id number]]     No orders of the defined types were placed in this encounter.   Return in about 4 weeks (around 07/10/2021) for f/u in person w/ CNM .  Cheral Marker CNM, [facility] 06/12/2021 2:16 PM

## 2021-06-16 LAB — CYTOLOGY - PAP
Chlamydia: NEGATIVE
Comment: NEGATIVE
Comment: NORMAL
Diagnosis: NEGATIVE
Neisseria Gonorrhea: NEGATIVE

## 2021-06-18 ENCOUNTER — Other Ambulatory Visit: Payer: Self-pay

## 2021-06-18 ENCOUNTER — Emergency Department (HOSPITAL_COMMUNITY)
Admission: EM | Admit: 2021-06-18 | Discharge: 2021-06-19 | Disposition: A | Discharge status: Home / Self Care | Payer: BC Managed Care – PPO | Attending: Emergency Medicine | Admitting: Emergency Medicine

## 2021-06-18 ENCOUNTER — Encounter (HOSPITAL_COMMUNITY): Payer: Self-pay

## 2021-06-18 DIAGNOSIS — S161XXA Strain of muscle, fascia and tendon at neck level, initial encounter: Secondary | ICD-10-CM | POA: Diagnosis not present

## 2021-06-18 DIAGNOSIS — I10 Essential (primary) hypertension: Secondary | ICD-10-CM | POA: Insufficient documentation

## 2021-06-18 DIAGNOSIS — Z981 Arthrodesis status: Secondary | ICD-10-CM | POA: Diagnosis not present

## 2021-06-18 DIAGNOSIS — S199XXA Unspecified injury of neck, initial encounter: Secondary | ICD-10-CM | POA: Diagnosis not present

## 2021-06-18 DIAGNOSIS — Z7984 Long term (current) use of oral hypoglycemic drugs: Secondary | ICD-10-CM | POA: Diagnosis not present

## 2021-06-18 DIAGNOSIS — Z041 Encounter for examination and observation following transport accident: Secondary | ICD-10-CM | POA: Diagnosis not present

## 2021-06-18 DIAGNOSIS — M25522 Pain in left elbow: Secondary | ICD-10-CM | POA: Diagnosis not present

## 2021-06-18 DIAGNOSIS — Z794 Long term (current) use of insulin: Secondary | ICD-10-CM | POA: Diagnosis not present

## 2021-06-18 DIAGNOSIS — M25511 Pain in right shoulder: Secondary | ICD-10-CM | POA: Diagnosis not present

## 2021-06-18 DIAGNOSIS — Y92410 Unspecified street and highway as the place of occurrence of the external cause: Secondary | ICD-10-CM | POA: Diagnosis not present

## 2021-06-18 DIAGNOSIS — R519 Headache, unspecified: Secondary | ICD-10-CM | POA: Diagnosis not present

## 2021-06-18 DIAGNOSIS — E119 Type 2 diabetes mellitus without complications: Secondary | ICD-10-CM | POA: Insufficient documentation

## 2021-06-18 DIAGNOSIS — R0902 Hypoxemia: Secondary | ICD-10-CM | POA: Diagnosis not present

## 2021-06-18 DIAGNOSIS — S29012A Strain of muscle and tendon of back wall of thorax, initial encounter: Secondary | ICD-10-CM | POA: Insufficient documentation

## 2021-06-18 DIAGNOSIS — S29019A Strain of muscle and tendon of unspecified wall of thorax, initial encounter: Secondary | ICD-10-CM

## 2021-06-18 DIAGNOSIS — M40204 Unspecified kyphosis, thoracic region: Secondary | ICD-10-CM | POA: Diagnosis not present
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
## 2021-06-19 ENCOUNTER — Emergency Department (HOSPITAL_COMMUNITY): Payer: BC Managed Care – PPO

## 2021-06-19 DIAGNOSIS — Z041 Encounter for examination and observation following transport accident: Secondary | ICD-10-CM | POA: Diagnosis not present

## 2021-06-19 DIAGNOSIS — M40204 Unspecified kyphosis, thoracic region: Secondary | ICD-10-CM | POA: Diagnosis not present

## 2021-06-19 DIAGNOSIS — Z981 Arthrodesis status: Secondary | ICD-10-CM | POA: Diagnosis not present

## 2021-06-19 DIAGNOSIS — M25522 Pain in left elbow: Secondary | ICD-10-CM | POA: Diagnosis not present

## 2021-06-19 DIAGNOSIS — M25511 Pain in right shoulder: Secondary | ICD-10-CM | POA: Diagnosis not present

## 2021-06-19 LAB — POC URINE PREG, ED: Preg Test, Ur: NEGATIVE

## 2021-06-19 MED ORDER — ACETAMINOPHEN 325 MG PO TABS
650.0000 mg | ORAL_TABLET | Freq: Once | ORAL | Status: AC
  Administered 2021-06-19: 650 mg via ORAL
  Filled 2021-06-19: qty 2

## 2021-06-19 MED ORDER — HYDROCODONE-ACETAMINOPHEN 5-325 MG PO TABS
1.0000 | ORAL_TABLET | Freq: Once | ORAL | Status: AC
  Administered 2021-06-19: 1 via ORAL
  Filled 2021-06-19: qty 1

## 2021-06-19 MED ORDER — TRAMADOL HCL 50 MG PO TABS: 50.0000 mg | ORAL_TABLET | Freq: Four times a day (QID) | ORAL | 0 refills | Status: DC | PRN
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
## 2021-06-19 NOTE — Discharge Instructions (Addendum)
Apply ice for 30 minutes at a time, 4 times a day.  Take ibuprofen or naproxen as needed for pain.  For additional pain relief, add acetaminophen.  Reserve tramadol for pain not relieved with the above-noted medications.

## 2021-06-24 DIAGNOSIS — M545 Low back pain, unspecified: Secondary | ICD-10-CM | POA: Diagnosis not present

## 2021-06-24 DIAGNOSIS — M542 Cervicalgia: Secondary | ICD-10-CM | POA: Diagnosis not present

## 2021-06-24 DIAGNOSIS — M25511 Pain in right shoulder: Secondary | ICD-10-CM | POA: Diagnosis not present

## 2021-06-24 DIAGNOSIS — R519 Headache, unspecified: Secondary | ICD-10-CM | POA: Diagnosis not present

## 2021-07-09 ENCOUNTER — Other Ambulatory Visit: Payer: Self-pay

## 2021-07-09 ENCOUNTER — Encounter: Payer: Self-pay | Admitting: Orthopaedic Surgery

## 2021-07-09 ENCOUNTER — Ambulatory Visit (INDEPENDENT_AMBULATORY_CARE_PROVIDER_SITE_OTHER): Payer: BC Managed Care – PPO | Admitting: Orthopaedic Surgery

## 2021-07-09 VITALS — BP 137/96 | HR 95 | Ht 66.0 in | Wt 197.4 lb

## 2021-07-09 DIAGNOSIS — E1165 Type 2 diabetes mellitus with hyperglycemia: Secondary | ICD-10-CM | POA: Diagnosis not present

## 2021-07-09 DIAGNOSIS — M5442 Lumbago with sciatica, left side: Secondary | ICD-10-CM | POA: Diagnosis not present

## 2021-07-09 DIAGNOSIS — M25511 Pain in right shoulder: Secondary | ICD-10-CM

## 2021-07-09 DIAGNOSIS — F4323 Adjustment disorder with mixed anxiety and depressed mood: Secondary | ICD-10-CM

## 2021-07-09 DIAGNOSIS — M542 Cervicalgia: Secondary | ICD-10-CM

## 2021-07-09 DIAGNOSIS — M5441 Lumbago with sciatica, right side: Secondary | ICD-10-CM

## 2021-07-09 MED ORDER — HYDROCODONE-ACETAMINOPHEN 5-325 MG PO TABS: ORAL_TABLET | ORAL | 0 refills | Status: DC

## 2021-07-09 MED ORDER — NAPROXEN 500 MG PO TABS: 500.0000 mg | ORAL_TABLET | Freq: Two times a day (BID) | ORAL | 5 refills | Status: DC

## 2021-07-09 MED ORDER — TIZANIDINE HCL 4 MG PO TABS: ORAL_TABLET | ORAL | 3 refills | Status: DC
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
## 2021-07-09 NOTE — Patient Instructions (Signed)
OUT OF WORK ?

## 2021-07-10 ENCOUNTER — Ambulatory Visit (INDEPENDENT_AMBULATORY_CARE_PROVIDER_SITE_OTHER): Payer: BC Managed Care – PPO | Admitting: Women's Health

## 2021-07-10 ENCOUNTER — Encounter: Payer: Self-pay | Admitting: Women's Health

## 2021-07-10 VITALS — BP 127/88 | HR 90 | Ht 66.0 in | Wt 195.0 lb

## 2021-07-10 DIAGNOSIS — B3731 Acute candidiasis of vulva and vagina: Secondary | ICD-10-CM

## 2021-07-10 DIAGNOSIS — B373 Candidiasis of vulva and vagina: Secondary | ICD-10-CM | POA: Diagnosis not present

## 2021-07-10 NOTE — Progress Notes (Signed)
   GYN VISIT Patient name: Maria Aguilar MRN [id number]  Date of birth: November 06, 1996 Chief Complaint:   Follow-up  History of Present Illness:   Maria Aguilar is a 25 y.o. 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  Has uncontrolled DM, she is trying to do better w/ controlling. Seen in office 7/8 for pap, had thick very white area at perineum, co-exam w/ [facility] who felt it was chronic yeast, so vulva was painted w/ gentian violet and she was given diflucan 100mg  x10d. States area feels much better.  No LMP recorded. (Menstrual status: Oral contraceptives). The current method of family planning is OCP (estrogen/progesterone).  Last pap 06/12/21. Results were: NILM w/ HRHPV not done  Depression screen [facility] 2/9 06/12/2021 01/26/2019 07/19/2018 06/03/2017 03/22/2017  Decreased Interest 0 2 0 0 0  Down, Depressed, Hopeless 0 2 0 0 0  PHQ - 2 Score 0 4 0 0 0  Altered sleeping 3 3 - 0 0  Tired, decreased energy 1 3 - 0 0  Change in appetite 1 3 - 0 0  Feeling bad or failure about yourself  0 1 - 0 0  Trouble concentrating 2 0 - 0 0  Moving slowly or fidgety/restless 0 0 - 0 0  Suicidal thoughts 0 0 - 0 0  PHQ-9 Score 7 14 - 0 0  Difficult doing work/chores - Very difficult - Not difficult at all Not difficult at all     GAD 7 : Generalized Anxiety Score 06/12/2021  Nervous, Anxious, on Edge 0  Control/stop worrying 0  Worry too much - different things 0  Trouble relaxing 2  Restless 0  Easily annoyed or irritable 0  Afraid - awful might happen 0  Total GAD 7 Score 2     Review of Systems:   Pertinent items are noted in HPI Denies fever/chills, dizziness, headaches, visual disturbances, fatigue, shortness of breath, chest pain, abdominal pain, vomiting, abnormal vaginal discharge/itching/odor/irritation, problems with periods, bowel movements, urination, or intercourse unless otherwise stated above.  Pertinent History Reviewed:  Reviewed past  medical,surgical, social, obstetrical and family history.  Reviewed problem list, medications and allergies. Physical Assessment:   Vitals:   07/10/21 1149  BP: 127/88  Pulse: 90  Weight: 195 lb (88.5 kg)  Height: 5\' 6"  (1.676 m)  Body mass index is 31.47 kg/m.       Physical Examination:   General appearance: alert, well appearing, and in no distress  Mental status: alert, oriented to person, place, and time  Skin: warm & dry   Cardiovascular: normal heart rate noted  Respiratory: normal respiratory effort, no distress  Abdomen: soft, non-tender   Pelvic: thick white area that was at perineum is now gone!  Extremities: no edema   Chaperone: 09/09/21    No results found for this or any previous visit (from the past 24 hour(s)).  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 Let me know if returns  Meds: No orders of the defined types were placed in this encounter.   No orders of the defined types were placed in this encounter.   Return in about 1 year (around 07/10/2022) for Physical.  Faith Rogue CNM, [facility] 07/10/2021 12:09 PM

## 2021-07-15 DIAGNOSIS — M5442 Lumbago with sciatica, left side: Secondary | ICD-10-CM | POA: Diagnosis not present

## 2021-07-15 DIAGNOSIS — M542 Cervicalgia: Secondary | ICD-10-CM | POA: Diagnosis not present

## 2021-07-15 DIAGNOSIS — M25511 Pain in right shoulder: Secondary | ICD-10-CM | POA: Diagnosis not present

## 2021-07-21 DIAGNOSIS — M25511 Pain in right shoulder: Secondary | ICD-10-CM | POA: Diagnosis not present

## 2021-07-21 DIAGNOSIS — M542 Cervicalgia: Secondary | ICD-10-CM | POA: Diagnosis not present

## 2021-07-21 DIAGNOSIS — M5442 Lumbago with sciatica, left side: Secondary | ICD-10-CM | POA: Diagnosis not present

## 2021-07-23 ENCOUNTER — Ambulatory Visit (INDEPENDENT_AMBULATORY_CARE_PROVIDER_SITE_OTHER): Payer: BC Managed Care – PPO | Admitting: Orthopaedic Surgery

## 2021-07-23 ENCOUNTER — Other Ambulatory Visit: Payer: Self-pay

## 2021-07-23 ENCOUNTER — Encounter: Payer: Self-pay | Admitting: Orthopaedic Surgery

## 2021-07-23 ENCOUNTER — Telehealth: Payer: Self-pay | Admitting: Orthopaedic Surgery

## 2021-07-23 VITALS — BP 133/95 | HR 104 | Ht 66.0 in | Wt 185.0 lb

## 2021-07-23 DIAGNOSIS — M5441 Lumbago with sciatica, right side: Secondary | ICD-10-CM

## 2021-07-23 DIAGNOSIS — M25511 Pain in right shoulder: Secondary | ICD-10-CM | POA: Diagnosis not present

## 2021-07-23 DIAGNOSIS — M5442 Lumbago with sciatica, left side: Secondary | ICD-10-CM | POA: Diagnosis not present

## 2021-07-23 DIAGNOSIS — M542 Cervicalgia: Secondary | ICD-10-CM | POA: Diagnosis not present

## 2021-07-23 DIAGNOSIS — E1165 Type 2 diabetes mellitus with hyperglycemia: Secondary | ICD-10-CM | POA: Diagnosis not present

## 2021-07-23 NOTE — Addendum Note (Signed)
Addended by: Cherre Huger E on: 07/23/2021 10:59 AM   Modules accepted: Orders

## 2021-07-23 NOTE — Progress Notes (Signed)
I am still hurting.  She has been to PT in [place] two times.  She has seen only slight improvement.  Her lower back is still hurting a lot.  She is taking her medicine and doing her exercises.  She has no weakness.  She has been out of work and needs further notes to stay out.  She has no new trauma.  ROM of the lumbar spine is tender but no spasm.  NV intact. SLR negative.  Muscle tone and strength is normal.  Encounter Diagnoses  Name Primary?   Cervicalgia Yes   Acute bilateral low back pain with bilateral sciatica    Uncontrolled type 2 diabetes mellitus with hyperglycemia (HCC)    I will get MRI.  Continue present medicine.  Stay out of work.  Continue PT.  Return in three weeks.  Call if any problem.  Precautions discussed.  Electronically Signed Darreld Mclean, MD 8/18/20229:41 AM

## 2021-07-23 NOTE — Progress Notes (Signed)
Work note and today's note faxed to [employer] at [phone].  Release in chart.

## 2021-07-23 NOTE — Patient Instructions (Signed)
Out of work 

## 2021-07-23 NOTE — Telephone Encounter (Signed)
error 

## 2021-07-24 ENCOUNTER — Ambulatory Visit: Payer: BC Managed Care – PPO | Admitting: Obstetrics & Gynecology

## 2021-07-29 DIAGNOSIS — E1165 Type 2 diabetes mellitus with hyperglycemia: Secondary | ICD-10-CM | POA: Diagnosis not present

## 2021-07-29 DIAGNOSIS — M545 Low back pain, unspecified: Secondary | ICD-10-CM | POA: Diagnosis not present

## 2021-07-29 DIAGNOSIS — M542 Cervicalgia: Secondary | ICD-10-CM | POA: Diagnosis not present

## 2021-07-30 DIAGNOSIS — M542 Cervicalgia: Secondary | ICD-10-CM | POA: Diagnosis not present

## 2021-07-30 DIAGNOSIS — M25511 Pain in right shoulder: Secondary | ICD-10-CM | POA: Diagnosis not present

## 2021-07-30 DIAGNOSIS — M5442 Lumbago with sciatica, left side: Secondary | ICD-10-CM | POA: Diagnosis not present

## 2021-08-04 ENCOUNTER — Telehealth: Payer: Self-pay | Admitting: Orthopaedic Surgery

## 2021-08-04 DIAGNOSIS — M542 Cervicalgia: Secondary | ICD-10-CM | POA: Diagnosis not present

## 2021-08-04 DIAGNOSIS — M5442 Lumbago with sciatica, left side: Secondary | ICD-10-CM | POA: Diagnosis not present

## 2021-08-04 DIAGNOSIS — M25511 Pain in right shoulder: Secondary | ICD-10-CM | POA: Diagnosis not present

## 2021-08-04 NOTE — Telephone Encounter (Signed)
Patient brought in a [employer] form which she said she spoke with you about, and that she was not to pay the 25.00 [employer] form fee. Please advise. (Form is in your box)* *Form is due 08/06/21.

## 2021-08-05 NOTE — Telephone Encounter (Signed)
Noted  

## 2021-08-06 DIAGNOSIS — M25511 Pain in right shoulder: Secondary | ICD-10-CM | POA: Diagnosis not present

## 2021-08-06 DIAGNOSIS — M5442 Lumbago with sciatica, left side: Secondary | ICD-10-CM | POA: Diagnosis not present

## 2021-08-06 DIAGNOSIS — M542 Cervicalgia: Secondary | ICD-10-CM | POA: Diagnosis not present

## 2021-08-07 ENCOUNTER — Other Ambulatory Visit: Payer: Self-pay

## 2021-08-07 ENCOUNTER — Ambulatory Visit (HOSPITAL_COMMUNITY)
Admission: RE | Admit: 2021-08-07 | Discharge: 2021-08-07 | Disposition: A | Discharge status: Home / Self Care | Payer: BC Managed Care – PPO | Source: Ambulatory Visit | Attending: Orthopaedic Surgery | Admitting: Orthopaedic Surgery

## 2021-08-07 DIAGNOSIS — Z7984 Long term (current) use of oral hypoglycemic drugs: Secondary | ICD-10-CM | POA: Diagnosis not present

## 2021-08-07 DIAGNOSIS — M5442 Lumbago with sciatica, left side: Secondary | ICD-10-CM | POA: Diagnosis not present

## 2021-08-07 DIAGNOSIS — I517 Cardiomegaly: Secondary | ICD-10-CM | POA: Diagnosis not present

## 2021-08-07 DIAGNOSIS — R112 Nausea with vomiting, unspecified: Secondary | ICD-10-CM | POA: Diagnosis not present

## 2021-08-07 DIAGNOSIS — R000 Tachycardia, unspecified: Secondary | ICD-10-CM | POA: Diagnosis not present

## 2021-08-07 DIAGNOSIS — I498 Other specified cardiac arrhythmias: Secondary | ICD-10-CM | POA: Diagnosis not present

## 2021-08-07 DIAGNOSIS — Z3202 Encounter for pregnancy test, result negative: Secondary | ICD-10-CM | POA: Diagnosis not present

## 2021-08-07 DIAGNOSIS — R0789 Other chest pain: Secondary | ICD-10-CM | POA: Diagnosis not present

## 2021-08-07 DIAGNOSIS — Z6831 Body mass index (BMI) 31.0-31.9, adult: Secondary | ICD-10-CM | POA: Diagnosis not present

## 2021-08-07 DIAGNOSIS — M545 Low back pain, unspecified: Secondary | ICD-10-CM | POA: Diagnosis not present

## 2021-08-07 DIAGNOSIS — E669 Obesity, unspecified: Secondary | ICD-10-CM | POA: Diagnosis not present

## 2021-08-07 DIAGNOSIS — R079 Chest pain, unspecified: Secondary | ICD-10-CM | POA: Diagnosis not present

## 2021-08-07 DIAGNOSIS — M5441 Lumbago with sciatica, right side: Secondary | ICD-10-CM | POA: Insufficient documentation

## 2021-08-07 DIAGNOSIS — E1165 Type 2 diabetes mellitus with hyperglycemia: Secondary | ICD-10-CM | POA: Diagnosis not present

## 2021-08-08 DIAGNOSIS — E1169 Type 2 diabetes mellitus with other specified complication: Secondary | ICD-10-CM | POA: Diagnosis not present

## 2021-08-08 DIAGNOSIS — R0789 Other chest pain: Secondary | ICD-10-CM | POA: Diagnosis not present

## 2021-08-08 DIAGNOSIS — E669 Obesity, unspecified: Secondary | ICD-10-CM | POA: Diagnosis not present

## 2021-08-08 DIAGNOSIS — I498 Other specified cardiac arrhythmias: Secondary | ICD-10-CM | POA: Diagnosis not present

## 2021-08-08 DIAGNOSIS — E1165 Type 2 diabetes mellitus with hyperglycemia: Secondary | ICD-10-CM | POA: Diagnosis not present

## 2021-08-08 DIAGNOSIS — Z3202 Encounter for pregnancy test, result negative: Secondary | ICD-10-CM | POA: Diagnosis not present

## 2021-08-11 DIAGNOSIS — M542 Cervicalgia: Secondary | ICD-10-CM | POA: Diagnosis not present

## 2021-08-11 DIAGNOSIS — M25511 Pain in right shoulder: Secondary | ICD-10-CM | POA: Diagnosis not present

## 2021-08-11 DIAGNOSIS — M5442 Lumbago with sciatica, left side: Secondary | ICD-10-CM | POA: Diagnosis not present

## 2021-08-13 ENCOUNTER — Other Ambulatory Visit: Payer: Self-pay

## 2021-08-13 ENCOUNTER — Ambulatory Visit (INDEPENDENT_AMBULATORY_CARE_PROVIDER_SITE_OTHER): Payer: BC Managed Care – PPO | Admitting: Orthopaedic Surgery

## 2021-08-13 ENCOUNTER — Encounter: Payer: Self-pay | Admitting: Orthopaedic Surgery

## 2021-08-13 DIAGNOSIS — M542 Cervicalgia: Secondary | ICD-10-CM

## 2021-08-13 DIAGNOSIS — Z09 Encounter for follow-up examination after completed treatment for conditions other than malignant neoplasm: Secondary | ICD-10-CM | POA: Diagnosis not present

## 2021-08-13 DIAGNOSIS — M25511 Pain in right shoulder: Secondary | ICD-10-CM | POA: Diagnosis not present

## 2021-08-13 DIAGNOSIS — M5441 Lumbago with sciatica, right side: Secondary | ICD-10-CM

## 2021-08-13 DIAGNOSIS — E119 Type 2 diabetes mellitus without complications: Secondary | ICD-10-CM | POA: Diagnosis not present

## 2021-08-13 DIAGNOSIS — Z794 Long term (current) use of insulin: Secondary | ICD-10-CM | POA: Diagnosis not present

## 2021-08-13 DIAGNOSIS — Z7984 Long term (current) use of oral hypoglycemic drugs: Secondary | ICD-10-CM | POA: Diagnosis not present

## 2021-08-13 DIAGNOSIS — M5442 Lumbago with sciatica, left side: Secondary | ICD-10-CM | POA: Diagnosis not present

## 2021-08-13 MED ORDER — HYDROCODONE-ACETAMINOPHEN 5-325 MG PO TABS: ORAL_TABLET | ORAL | 0 refills | Status: DC

## 2021-08-13 NOTE — Progress Notes (Signed)
My neck hurts now.  Her lower back pain is less after PT and doing her exercises at home.  She has no new trauma.  She had MRI of the lumbar spine which showed: IMPRESSION: 1. Mild degenerative disc disease at T11-12 and L5-S1 without significant stenosis or neural impingement. 2. Mild bilateral facet hypertrophy at L2-3 through L5-S1. 3. Underlying mild dextroscoliosis with exaggeration of the normal lumbar lordosis.  She has been told of the findings.    I have independently reviewed the MRI.    She has pain in the neck bending to the left.  She has some tightness of the upper left trapezius.  NV intact. ROM of the shoulder is full on left and right.  Encounter Diagnoses  Name Primary?   Cervicalgia Yes   Acute bilateral low back pain with bilateral sciatica    Do PT for the lower back.  I have reviewed the [place] Controlled Substance Reporting System web site prior to prescribing narcotic medicine for this patient.  Return in three weeks.  Call if any problem.  Precautions discussed.  Electronically Signed Darreld Mclean, MD 9/8/202210:18 AM

## 2021-08-17 ENCOUNTER — Telehealth: Payer: Self-pay | Admitting: Radiology

## 2021-08-17 NOTE — Telephone Encounter (Signed)
Patient called and said that [employer] did not receive her disability form.  I verified that it was sent via release 08/14/21 at 119pm.  I will call them tomorrow first thing to see if they see it then, if not will resend to them at whichever way they prefer for [place] to send it so they will have it.  Will call patient afterwards to advise.

## 2021-08-17 NOTE — Telephone Encounter (Signed)
Patient called back, she called them again and they have found her form, no action needed.

## 2021-08-18 DIAGNOSIS — M5442 Lumbago with sciatica, left side: Secondary | ICD-10-CM | POA: Diagnosis not present

## 2021-08-18 DIAGNOSIS — M25511 Pain in right shoulder: Secondary | ICD-10-CM | POA: Diagnosis not present

## 2021-08-18 DIAGNOSIS — M542 Cervicalgia: Secondary | ICD-10-CM | POA: Diagnosis not present

## 2021-08-25 DIAGNOSIS — M542 Cervicalgia: Secondary | ICD-10-CM | POA: Diagnosis not present

## 2021-08-25 DIAGNOSIS — M5442 Lumbago with sciatica, left side: Secondary | ICD-10-CM | POA: Diagnosis not present

## 2021-08-25 DIAGNOSIS — M25511 Pain in right shoulder: Secondary | ICD-10-CM | POA: Diagnosis not present

## 2021-08-27 DIAGNOSIS — M5442 Lumbago with sciatica, left side: Secondary | ICD-10-CM | POA: Diagnosis not present

## 2021-08-27 DIAGNOSIS — M25511 Pain in right shoulder: Secondary | ICD-10-CM | POA: Diagnosis not present

## 2021-08-27 DIAGNOSIS — M542 Cervicalgia: Secondary | ICD-10-CM | POA: Diagnosis not present

## 2021-09-01 DIAGNOSIS — M25511 Pain in right shoulder: Secondary | ICD-10-CM | POA: Diagnosis not present

## 2021-09-01 DIAGNOSIS — M542 Cervicalgia: Secondary | ICD-10-CM | POA: Diagnosis not present

## 2021-09-01 DIAGNOSIS — M5442 Lumbago with sciatica, left side: Secondary | ICD-10-CM | POA: Diagnosis not present

## 2021-09-03 ENCOUNTER — Encounter: Payer: Self-pay | Admitting: Orthopaedic Surgery

## 2021-09-03 ENCOUNTER — Other Ambulatory Visit: Payer: Self-pay

## 2021-09-03 ENCOUNTER — Ambulatory Visit (INDEPENDENT_AMBULATORY_CARE_PROVIDER_SITE_OTHER): Payer: BC Managed Care – PPO | Admitting: Orthopaedic Surgery

## 2021-09-03 DIAGNOSIS — M542 Cervicalgia: Secondary | ICD-10-CM | POA: Diagnosis not present

## 2021-09-03 DIAGNOSIS — M5442 Lumbago with sciatica, left side: Secondary | ICD-10-CM | POA: Diagnosis not present

## 2021-09-03 DIAGNOSIS — M25511 Pain in right shoulder: Secondary | ICD-10-CM | POA: Diagnosis not present

## 2021-09-03 NOTE — Progress Notes (Signed)
My neck still hurts.  She has been to PT in [place] and is a little better but still has neck pain.  It is not as intense.  I have reviewed PT notes.    NV intact. ROM of neck is full but tender.    Encounter Diagnosis  Name Primary?   Cervicalgia Yes   Continue PT.  Stay out of work.  Return in two weeks.  Call if any problem.  Precautions discussed.  Electronically Signed Darreld Mclean, MD 9/29/202210:28 AM

## 2021-09-03 NOTE — Patient Instructions (Signed)
Out of work 

## 2021-09-10 DIAGNOSIS — Z794 Long term (current) use of insulin: Secondary | ICD-10-CM | POA: Diagnosis not present

## 2021-09-10 DIAGNOSIS — E119 Type 2 diabetes mellitus without complications: Secondary | ICD-10-CM | POA: Diagnosis not present

## 2021-09-15 DIAGNOSIS — E1165 Type 2 diabetes mellitus with hyperglycemia: Secondary | ICD-10-CM | POA: Diagnosis not present

## 2021-09-15 DIAGNOSIS — F411 Generalized anxiety disorder: Secondary | ICD-10-CM | POA: Diagnosis not present

## 2021-09-15 DIAGNOSIS — Z794 Long term (current) use of insulin: Secondary | ICD-10-CM | POA: Diagnosis not present

## 2021-09-15 DIAGNOSIS — R030 Elevated blood-pressure reading, without diagnosis of hypertension: Secondary | ICD-10-CM | POA: Diagnosis not present

## 2021-09-17 ENCOUNTER — Encounter: Payer: Self-pay | Admitting: Orthopaedic Surgery

## 2021-09-17 ENCOUNTER — Ambulatory Visit (INDEPENDENT_AMBULATORY_CARE_PROVIDER_SITE_OTHER): Payer: BC Managed Care – PPO | Admitting: Orthopaedic Surgery

## 2021-09-17 ENCOUNTER — Other Ambulatory Visit: Payer: Self-pay

## 2021-09-17 VITALS — BP 163/106 | HR 128 | Ht 66.0 in | Wt 183.4 lb

## 2021-09-17 DIAGNOSIS — F4323 Adjustment disorder with mixed anxiety and depressed mood: Secondary | ICD-10-CM

## 2021-09-17 DIAGNOSIS — M25511 Pain in right shoulder: Secondary | ICD-10-CM | POA: Diagnosis not present

## 2021-09-17 DIAGNOSIS — M542 Cervicalgia: Secondary | ICD-10-CM

## 2021-09-17 DIAGNOSIS — M5442 Lumbago with sciatica, left side: Secondary | ICD-10-CM | POA: Diagnosis not present

## 2021-09-17 NOTE — Progress Notes (Signed)
My neck is about the same.  She missed some PT for the neck secondary to other medical issues.  I have read the notes of the sessions she went.  She has neck pain, less than before, but still present. She has no new trauma, no numbness.  Neck has good ROM today, no spasm, NV intact.  Encounter Diagnoses  Name Primary?   Situational mixed anxiety and depressive disorder    Cervicalgia Yes   Resume PT.  Continue exercises at home.  Return in one month.  Call if any problem.  Precautions discussed.  Electronically Signed Darreld Mclean, MD 10/13/202210:31 AM

## 2021-09-18 DIAGNOSIS — M542 Cervicalgia: Secondary | ICD-10-CM | POA: Diagnosis not present

## 2021-09-18 DIAGNOSIS — M25511 Pain in right shoulder: Secondary | ICD-10-CM | POA: Diagnosis not present

## 2021-09-18 DIAGNOSIS — M5442 Lumbago with sciatica, left side: Secondary | ICD-10-CM | POA: Diagnosis not present

## 2021-09-22 DIAGNOSIS — M542 Cervicalgia: Secondary | ICD-10-CM | POA: Diagnosis not present

## 2021-09-22 DIAGNOSIS — M25511 Pain in right shoulder: Secondary | ICD-10-CM | POA: Diagnosis not present

## 2021-09-22 DIAGNOSIS — M5442 Lumbago with sciatica, left side: Secondary | ICD-10-CM | POA: Diagnosis not present

## 2021-09-24 DIAGNOSIS — M5442 Lumbago with sciatica, left side: Secondary | ICD-10-CM | POA: Diagnosis not present

## 2021-09-24 DIAGNOSIS — M25511 Pain in right shoulder: Secondary | ICD-10-CM | POA: Diagnosis not present

## 2021-09-24 DIAGNOSIS — M542 Cervicalgia: Secondary | ICD-10-CM | POA: Diagnosis not present

## 2021-09-29 DIAGNOSIS — M5442 Lumbago with sciatica, left side: Secondary | ICD-10-CM | POA: Diagnosis not present

## 2021-09-29 DIAGNOSIS — M542 Cervicalgia: Secondary | ICD-10-CM | POA: Diagnosis not present

## 2021-09-29 DIAGNOSIS — M25511 Pain in right shoulder: Secondary | ICD-10-CM | POA: Diagnosis not present

## 2021-10-01 DIAGNOSIS — M542 Cervicalgia: Secondary | ICD-10-CM | POA: Diagnosis not present

## 2021-10-01 DIAGNOSIS — M25511 Pain in right shoulder: Secondary | ICD-10-CM | POA: Diagnosis not present

## 2021-10-01 DIAGNOSIS — M5442 Lumbago with sciatica, left side: Secondary | ICD-10-CM | POA: Diagnosis not present

## 2021-10-06 DIAGNOSIS — M542 Cervicalgia: Secondary | ICD-10-CM | POA: Diagnosis not present

## 2021-10-06 DIAGNOSIS — M25511 Pain in right shoulder: Secondary | ICD-10-CM | POA: Diagnosis not present

## 2021-10-06 DIAGNOSIS — M5442 Lumbago with sciatica, left side: Secondary | ICD-10-CM | POA: Diagnosis not present

## 2021-10-07 DIAGNOSIS — E1165 Type 2 diabetes mellitus with hyperglycemia: Secondary | ICD-10-CM | POA: Diagnosis not present

## 2021-10-07 DIAGNOSIS — Z794 Long term (current) use of insulin: Secondary | ICD-10-CM | POA: Diagnosis not present

## 2021-10-08 DIAGNOSIS — M542 Cervicalgia: Secondary | ICD-10-CM | POA: Diagnosis not present

## 2021-10-08 DIAGNOSIS — M5442 Lumbago with sciatica, left side: Secondary | ICD-10-CM | POA: Diagnosis not present

## 2021-10-08 DIAGNOSIS — M25511 Pain in right shoulder: Secondary | ICD-10-CM | POA: Diagnosis not present

## 2021-10-12 DIAGNOSIS — R030 Elevated blood-pressure reading, without diagnosis of hypertension: Secondary | ICD-10-CM | POA: Diagnosis not present

## 2021-10-12 DIAGNOSIS — F411 Generalized anxiety disorder: Secondary | ICD-10-CM | POA: Diagnosis not present

## 2021-10-12 DIAGNOSIS — M549 Dorsalgia, unspecified: Secondary | ICD-10-CM | POA: Diagnosis not present

## 2021-10-12 DIAGNOSIS — F410 Panic disorder [episodic paroxysmal anxiety] without agoraphobia: Secondary | ICD-10-CM | POA: Diagnosis not present

## 2021-10-13 DIAGNOSIS — M542 Cervicalgia: Secondary | ICD-10-CM | POA: Diagnosis not present

## 2021-10-13 DIAGNOSIS — M25511 Pain in right shoulder: Secondary | ICD-10-CM | POA: Diagnosis not present

## 2021-10-13 DIAGNOSIS — M5442 Lumbago with sciatica, left side: Secondary | ICD-10-CM | POA: Diagnosis not present

## 2021-10-15 DIAGNOSIS — M5442 Lumbago with sciatica, left side: Secondary | ICD-10-CM | POA: Diagnosis not present

## 2021-10-15 DIAGNOSIS — M25511 Pain in right shoulder: Secondary | ICD-10-CM | POA: Diagnosis not present

## 2021-10-15 DIAGNOSIS — M542 Cervicalgia: Secondary | ICD-10-CM | POA: Diagnosis not present

## 2021-10-20 ENCOUNTER — Encounter: Payer: Self-pay | Admitting: Orthopaedic Surgery

## 2021-10-20 ENCOUNTER — Other Ambulatory Visit: Payer: Self-pay

## 2021-10-20 ENCOUNTER — Ambulatory Visit (INDEPENDENT_AMBULATORY_CARE_PROVIDER_SITE_OTHER): Payer: BC Managed Care – PPO | Admitting: Orthopaedic Surgery

## 2021-10-20 VITALS — BP 153/104 | HR 115 | Ht 66.0 in | Wt 190.6 lb

## 2021-10-20 DIAGNOSIS — M542 Cervicalgia: Secondary | ICD-10-CM | POA: Diagnosis not present

## 2021-10-20 DIAGNOSIS — M25511 Pain in right shoulder: Secondary | ICD-10-CM | POA: Diagnosis not present

## 2021-10-20 DIAGNOSIS — M5442 Lumbago with sciatica, left side: Secondary | ICD-10-CM | POA: Diagnosis not present

## 2021-10-20 NOTE — Patient Instructions (Addendum)
OOW note to return on Monday 11/02/21 no restrictions. Suggest a pull out lower keyboard for work.

## 2021-10-20 NOTE — Progress Notes (Signed)
Today I complete PT.  She has done well in PT.  Her neck pain is less.  She has some left sided pain but overall improved.  I have reviewed the PT notes.  She has no weakness or numbness.  ROM of the neck is full today, no spasm.  ROM of shoulders full.  NV intact.  Encounter Diagnosis  Name Primary?   Cervicalgia Yes   I will have her return to work 11-02-21.  I have recommended she consider pull out keyboard from desk to take stress off neck and shoulders.  I have asked her to have someone take picture of her at the desk so she can see her posture.  Continue exercises at home.    Return PRN.  Call if any problem.  Precautions discussed.  Electronically Signed Darreld Mclean, MD 11/15/202211:03 AM

## 2021-10-22 DIAGNOSIS — M25511 Pain in right shoulder: Secondary | ICD-10-CM | POA: Diagnosis not present

## 2021-10-22 DIAGNOSIS — M5442 Lumbago with sciatica, left side: Secondary | ICD-10-CM | POA: Diagnosis not present

## 2021-10-22 DIAGNOSIS — M542 Cervicalgia: Secondary | ICD-10-CM | POA: Diagnosis not present

## 2021-10-26 DIAGNOSIS — M542 Cervicalgia: Secondary | ICD-10-CM | POA: Diagnosis not present

## 2021-10-26 DIAGNOSIS — M5442 Lumbago with sciatica, left side: Secondary | ICD-10-CM | POA: Diagnosis not present

## 2021-10-26 DIAGNOSIS — M25511 Pain in right shoulder: Secondary | ICD-10-CM | POA: Diagnosis not present

## 2021-11-06 DIAGNOSIS — M542 Cervicalgia: Secondary | ICD-10-CM | POA: Diagnosis not present

## 2021-11-06 DIAGNOSIS — M5442 Lumbago with sciatica, left side: Secondary | ICD-10-CM | POA: Diagnosis not present

## 2021-11-06 DIAGNOSIS — M25511 Pain in right shoulder: Secondary | ICD-10-CM | POA: Diagnosis not present

## 2021-11-13 ENCOUNTER — Encounter: Payer: Self-pay | Admitting: Orthopaedic Surgery

## 2021-11-13 DIAGNOSIS — M25511 Pain in right shoulder: Secondary | ICD-10-CM | POA: Diagnosis not present

## 2021-11-13 DIAGNOSIS — M542 Cervicalgia: Secondary | ICD-10-CM | POA: Diagnosis not present

## 2021-11-13 DIAGNOSIS — M5442 Lumbago with sciatica, left side: Secondary | ICD-10-CM | POA: Diagnosis not present

## 2021-11-20 DIAGNOSIS — M25511 Pain in right shoulder: Secondary | ICD-10-CM | POA: Diagnosis not present

## 2021-11-20 DIAGNOSIS — M5442 Lumbago with sciatica, left side: Secondary | ICD-10-CM | POA: Diagnosis not present

## 2021-11-20 DIAGNOSIS — M542 Cervicalgia: Secondary | ICD-10-CM | POA: Diagnosis not present

## 2021-11-27 DIAGNOSIS — M5442 Lumbago with sciatica, left side: Secondary | ICD-10-CM | POA: Diagnosis not present

## 2021-11-27 DIAGNOSIS — M25511 Pain in right shoulder: Secondary | ICD-10-CM | POA: Diagnosis not present

## 2021-11-27 DIAGNOSIS — M542 Cervicalgia: Secondary | ICD-10-CM | POA: Diagnosis not present

## 2021-12-01 ENCOUNTER — Telehealth: Payer: Self-pay | Admitting: Orthopaedic Surgery

## 2021-12-01 NOTE — Telephone Encounter (Signed)
Patient called, states she followed up with her human resources department for her accomodation form; states she was told that they did not receive it.  Per Release of Information system, form was sent 11/18/21, per [facility] notes, which indicate"delivered". We re-faxed manually today to same fax number [phone]; patient aware.

## 2021-12-04 DIAGNOSIS — M5451 Vertebrogenic low back pain: Secondary | ICD-10-CM | POA: Diagnosis not present

## 2021-12-09 NOTE — Telephone Encounter (Signed)
Patient called back to relay that her HR manager Damali Broadfoot, fax# [phone] is still awaiting further information regarding her accommodation form. I faxed a note back indicating that it is being addressed, as patient said they have heard nothing from our office. We have messages noted. I relayed also that Toniann Fail is out of clinic until next week.

## 2021-12-10 NOTE — Telephone Encounter (Signed)
Update 12/10/21 Call received from Antionette Fairy, [place] department - inquiring about status .Did not receive fax we had sent yesterday* gave [place] ph# [phone], fax [phone]; re-faxed. I again relayed that the matter is being addressed, and response to follow after [facility] return next week. States what is needed is the duration date, per option #5 on the accommodation form.

## 2021-12-15 ENCOUNTER — Telehealth: Payer: Self-pay | Admitting: Radiology

## 2021-12-15 NOTE — Telephone Encounter (Signed)
Patient sent a MyChart message stating she is having a lot of pain and would like a refill of pain medication.  I don't see anything in her current meds from you.  Please advise, thanks.

## 2021-12-17 MED ORDER — HYDROCODONE-ACETAMINOPHEN 5-325 MG PO TABS: ORAL_TABLET | ORAL | 0 refills | Status: DC

## 2021-12-29 ENCOUNTER — Telehealth: Payer: Self-pay | Admitting: Orthopaedic Surgery

## 2021-12-29 NOTE — Telephone Encounter (Signed)
Call from patient's employer, per Human resources contact, [place], regarding accommodation form; states received however as per a previous call, needs "duration" in order to complete; states it is item #5 on form. Please advise. Her ph# is [phone] / fax# [phone]

## 2022-01-01 NOTE — Telephone Encounter (Signed)
Patient called as well in regard to this phone message of 12/29/21; states that her employer notified her that if this information is not received by 01/05/22 from provider's office that her accommodation request will be closed. Please advise.

## 2022-01-04 ENCOUNTER — Telehealth: Payer: Self-pay

## 2022-01-04 NOTE — Telephone Encounter (Signed)
I called back to patient - reached voice mail, left message* Note - Abigail Butts had faxed back the form with Item #5 completed on Friday, 01/01/22, to [place] (see note).

## 2022-01-04 NOTE — Telephone Encounter (Signed)
Patient called to speak with you in reference to her accomodation form. Her HR at work is needing this information. Please give her a call about this information [phone]. Patient states this has been going on now for about 1 1/2 months.

## 2022-01-05 NOTE — Telephone Encounter (Signed)
I called patient, LMVM confirming for her that p. 5 was faxed, that was the page that had missing info.

## 2022-01-05 NOTE — Telephone Encounter (Signed)
Confirmed, faxed page #5 Friday morning, 01/01/22 (scanning in)

## 2022-01-12 MED ORDER — TRAMADOL HCL 50 MG PO TABS: 50.0000 mg | ORAL_TABLET | Freq: Four times a day (QID) | ORAL | 2 refills | Status: DC | PRN

## 2022-01-12 MED ORDER — TIZANIDINE HCL 4 MG PO TABS: ORAL_TABLET | ORAL | 3 refills | Status: DC

## 2022-01-12 MED ORDER — DICLOFENAC SODIUM 75 MG PO TBEC: 75.0000 mg | DELAYED_RELEASE_TABLET | Freq: Two times a day (BID) | ORAL | 2 refills | Status: DC

## 2022-01-16 ENCOUNTER — Encounter: Payer: Self-pay | Admitting: Orthopaedic Surgery

## 2022-01-21 ENCOUNTER — Encounter: Payer: Self-pay | Admitting: Radiology

## 2022-01-21 NOTE — Telephone Encounter (Signed)
I called [facility], they have faxed [place] the PA info several times, I have asked her to do it again, have not received.  Will work on [place].

## 2022-05-05 ENCOUNTER — Other Ambulatory Visit (HOSPITAL_COMMUNITY)
Admission: RE | Admit: 2022-05-05 | Discharge: 2022-05-05 | Disposition: A | Discharge status: Home / Self Care | Payer: BC Managed Care – PPO | Source: Ambulatory Visit | Attending: Obstetrics & Gynecology | Admitting: Obstetrics & Gynecology

## 2022-05-05 ENCOUNTER — Other Ambulatory Visit (INDEPENDENT_AMBULATORY_CARE_PROVIDER_SITE_OTHER): Payer: BC Managed Care – PPO | Admitting: *Deleted

## 2022-05-05 DIAGNOSIS — Z113 Encounter for screening for infections with a predominantly sexual mode of transmission: Secondary | ICD-10-CM | POA: Insufficient documentation

## 2022-05-05 DIAGNOSIS — N898 Other specified noninflammatory disorders of vagina: Secondary | ICD-10-CM

## 2022-05-05 DIAGNOSIS — B3731 Acute candidiasis of vulva and vagina: Secondary | ICD-10-CM | POA: Insufficient documentation

## 2022-05-05 NOTE — Progress Notes (Signed)
   NURSE VISIT- VAGINITIS/STD  SUBJECTIVE:  Maria Aguilar is a 26 y.o. No obstetric history on file. GYN patientfemale here for a vaginal swab for vaginitis screening, STD screen.  She reports the following symptoms:  vaginal itching  for 2 weeks. Denies abnormal vaginal bleeding, significant pelvic pain, fever, or UTI symptoms.  OBJECTIVE:  There were no vitals taken for this visit.  Appears well, in no apparent distress  ASSESSMENT: Vaginal swab for vaginitis screening & STD screening  PLAN: Self-collected vaginal probe for Gonorrhea, Chlamydia, Trichomonas, Bacterial Vaginosis, Yeast sent to lab Treatment: to be determined once results are received Follow-up as needed if symptoms persist/worsen, or new symptoms develop  Malachy Mood  05/05/2022 2:04 PM

## 2022-05-07 ENCOUNTER — Other Ambulatory Visit: Payer: Self-pay | Admitting: Adult Health

## 2022-05-07 LAB — CERVICOVAGINAL ANCILLARY ONLY
Bacterial Vaginitis (gardnerella): NEGATIVE
Candida Glabrata: POSITIVE — AB
Candida Vaginitis: POSITIVE — AB
Chlamydia: NEGATIVE
Comment: NEGATIVE
Comment: NEGATIVE
Comment: NEGATIVE
Comment: NEGATIVE
Comment: NEGATIVE
Comment: NORMAL
Neisseria Gonorrhea: NEGATIVE
Trichomonas: NEGATIVE

## 2022-05-07 MED ORDER — FLUCONAZOLE 100 MG PO TABS: ORAL_TABLET | ORAL | 0 refills | Status: DC

## 2022-05-07 NOTE — Progress Notes (Signed)
Will rx diflucan for +yeast on vaginal swab 

## 2022-05-13 ENCOUNTER — Telehealth: Payer: Self-pay | Admitting: Orthopaedic Surgery

## 2022-06-16 ENCOUNTER — Ambulatory Visit: Payer: BC Managed Care – PPO | Admitting: Adult Health
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
## 2022-06-23 ENCOUNTER — Encounter: Payer: Self-pay | Admitting: Adult Health

## 2022-06-23 ENCOUNTER — Ambulatory Visit (INDEPENDENT_AMBULATORY_CARE_PROVIDER_SITE_OTHER): Payer: BC Managed Care – PPO | Admitting: Adult Health

## 2022-06-23 ENCOUNTER — Other Ambulatory Visit (HOSPITAL_COMMUNITY)
Admission: RE | Admit: 2022-06-23 | Discharge: 2022-06-23 | Disposition: A | Discharge status: Home / Self Care | Payer: BC Managed Care – PPO | Source: Ambulatory Visit | Attending: Adult Health | Admitting: Adult Health

## 2022-06-23 VITALS — BP 152/103 | HR 98 | Ht 66.5 in | Wt 221.5 lb

## 2022-06-23 DIAGNOSIS — N898 Other specified noninflammatory disorders of vagina: Secondary | ICD-10-CM | POA: Insufficient documentation

## 2022-06-23 DIAGNOSIS — I10 Essential (primary) hypertension: Secondary | ICD-10-CM | POA: Diagnosis not present

## 2022-06-23 DIAGNOSIS — Z01419 Encounter for gynecological examination (general) (routine) without abnormal findings: Secondary | ICD-10-CM

## 2022-06-23 DIAGNOSIS — Z3041 Encounter for surveillance of contraceptive pills: Secondary | ICD-10-CM

## 2022-06-23 MED ORDER — LISINOPRIL 10 MG PO TABS: 10.0000 mg | ORAL_TABLET | Freq: Every day | ORAL | 6 refills | Status: DC

## 2022-06-23 MED ORDER — NORETHINDRONE 0.35 MG PO TABS: 1.0000 | ORAL_TABLET | Freq: Every day | ORAL | 1 refills | Status: DC

## 2022-06-23 NOTE — Progress Notes (Signed)
Patient ID: Tacoya Altizer, female   DOB: 1996-01-14, 26 y.o.   MRN: [id number] History of Present Illness: Katyra is a 26 year old black female,single, G0P0, in for a well woman gyn exam. She is having some vaginal itching has had yeast in the past. She has diabetes and A1c is about 10.  She says she has gained weight.  Lab Results  Component Value Date   DIAGPAP  06/12/2021    - Negative for intraepithelial lesion or malignancy (NILM)    PCP is [facility], and sees endocrinologist at [facility]   Current Medications, Allergies, Past Medical History, Past Surgical History, Family History and Social History were reviewed in [employer] record.     Review of Systems: Patient denies any headaches, hearing loss, fatigue, blurred vision, shortness of breath, chest pain, abdominal pain, problems with bowel movements, urination, or intercourse(no sex in over a year). No joint pain or mood swings.   See HPI for positives   Physical Exam:BP (!) 152/103 (BP Location: Left Arm, Cuff Size: Large)   Pulse 98   Ht 5' 6.5" (1.689 m)   Wt 221 lb 8 oz (100.5 kg)   BMI 35.22 kg/m   General:  Well developed, well nourished, no acute distress Skin:  Warm and dry Neck:  Midline trachea, normal thyroid, good ROM, no lymphadenopathy Lungs; Clear to auscultation bilaterally Breast:  No dominant palpable mass, retraction, or nipple discharge Cardiovascular: Regular rate and rhythm Abdomen:  Soft, non tender, no hepatosplenomegaly Pelvic:  External genitalia is normal in appearance, no lesions.  The vagina has white discharge, no odor. Urethra has no lesions or masses. The cervix is smooth, CV swab obtained.  Uterus is felt to be normal size, shape, and contour.  No adnexal masses or tenderness noted.Bladder is non tender, no masses felt. Rectal: Deferred. Extremities/musculoskeletal:  No swelling or varicosities noted, no clubbing or cyanosis Psych:  No mood changes, alert and  cooperative,seems happy AA is 0 Fall risk I slow    06/23/2022    2:53 PM 06/12/2021    1:15 PM 01/26/2019   12:00 PM  Depression screen PHQ 2/9  Decreased Interest 0 0 2  Down, Depressed, Hopeless 0 0 2  PHQ - 2 Score 0 0 4  Altered sleeping 0 3 3  Tired, decreased energy 1 1 3   Change in appetite 0 1 3  Feeling bad or failure about yourself  0 0 1  Trouble concentrating 0 2 0  Moving slowly or fidgety/restless 0 0 0  Suicidal thoughts 0 0 0  PHQ-9 Score 1 7 14   Difficult doing work/chores   Very difficult       06/23/2022    2:54 PM 06/12/2021    1:15 PM  GAD 7 : Generalized Anxiety Score  Nervous, Anxious, on Edge 0 0  Control/stop worrying 0 0  Worry too much - different things 0 0  Trouble relaxing 0 2  Restless 0 0  Easily annoyed or irritable 1 0  Afraid - awful might happen 0 0  Total GAD 7 Score 1 2      Upstream - 06/23/22 1459       Pregnancy Intention Screening   Does the patient want to become pregnant in the next year? Ok Either Way    Does the patient's partner want to become pregnant in the next year? Ok Either Way    Would the patient like to discuss contraceptive options today? No  Contraception Wrap Up   Current Method Oral Contraceptive;Abstinence    End Method Oral Contraceptive;Abstinence    Contraception Counseling Provided Yes             Examination chaperoned by Malachy Mood LPN    Impression and Plan: 1. Encounter for well woman exam with routine gynecological exam Physical in 1 year Pap in 2025  Labs with PCP and endocrinologist   2. Hypertension, unspecified type Decrease salt and sugar Review DASH diet Will rx lisinopril  10 mg 1 daily  And stop COC and to to POP Keep check on BP at home and keep log Follow up with me in 4 weeks for BP check and ROS   Meds ordered this encounter  Medications   norethindrone (MICRONOR) 0.35 MG tablet    Sig: Take 1 tablet (0.35 mg total) by mouth daily.    Dispense:  84 tablet     Refill:  1    Order Specific Question:   Supervising Provider    Answer:   Despina Hidden, LUTHER H [[id number]]   lisinopril (ZESTRIL) 10 MG tablet    Sig: Take 1 tablet (10 mg total) by mouth daily.    Dispense:  30 tablet    Refill:  6    Order Specific Question:   Supervising Provider    Answer:   Despina Hidden, LUTHER H [[id number]]    3. Encounter for surveillance of contraceptive pills Start Micronor today  4. Vaginal itching CV swab obtained for BV and yeast  - Cervicovaginal ancillary only( [place])  5. Vaginal discharge CV swab sent for BV and yeast, will talk when results back  - Cervicovaginal ancillary only( [place])

## 2022-06-25 LAB — CERVICOVAGINAL ANCILLARY ONLY
Bacterial Vaginitis (gardnerella): NEGATIVE
Candida Glabrata: NEGATIVE
Candida Vaginitis: NEGATIVE
Comment: NEGATIVE
Comment: NEGATIVE
Comment: NEGATIVE

## 2022-07-16 ENCOUNTER — Telehealth: Payer: Self-pay | Admitting: Orthopaedic Surgery

## 2022-07-21 ENCOUNTER — Ambulatory Visit: Payer: BC Managed Care – PPO | Admitting: Adult Health

## 2022-07-23 ENCOUNTER — Ambulatory Visit: Payer: BC Managed Care – PPO | Admitting: Adult Health

## 2022-08-05 ENCOUNTER — Ambulatory Visit: Payer: BC Managed Care – PPO | Admitting: Adult Health

## 2022-08-12 ENCOUNTER — Encounter: Payer: Self-pay | Admitting: Adult Health

## 2022-08-12 ENCOUNTER — Ambulatory Visit (INDEPENDENT_AMBULATORY_CARE_PROVIDER_SITE_OTHER): Payer: BC Managed Care – PPO | Admitting: Adult Health

## 2022-08-12 VITALS — BP 126/88 | HR 111 | Ht 66.5 in | Wt 224.0 lb

## 2022-08-12 DIAGNOSIS — I10 Essential (primary) hypertension: Secondary | ICD-10-CM | POA: Diagnosis not present

## 2022-08-12 DIAGNOSIS — Z3041 Encounter for surveillance of contraceptive pills: Secondary | ICD-10-CM

## 2022-08-12 NOTE — Progress Notes (Signed)
  Subjective:     Patient ID: [name], female   DOB: 1996-10-25, 26 y.o.   MRN: [id number]  HPI Verania is a 26 year old black female,single, G0P0, in for BP check after starting lisinopril and switching to Micronor.Doing good, no complaints. BP at home around 138/93.   Last pap was negative 06/12/21.  Review of Systems No complaints today No headaches Reviewed past medical,surgical, social and family history. Reviewed medications and allergies.     Objective:   Physical Exam BP 126/88 (BP Location: Left Arm, Patient Position: Sitting, Cuff Size: Normal)   Pulse (!) 111   Ht 5' 6.5" (1.689 m)   Wt 224 lb (101.6 kg)   LMP 08/10/2022 (Approximate)   BMI 35.61 kg/m     Skin warm and dry.Lungs: clear to ausculation bilaterally. Cardiovascular: regular rate and rhythm.   Upstream - 08/12/22 1415       Pregnancy Intention Screening   Does the patient want to become pregnant in the next year? No    Does the patient's partner want to become pregnant in the next year? No    Would the patient like to discuss contraceptive options today? No      Contraception Wrap Up   Current Method Oral Contraceptive    End Method Oral Contraceptive             Assessment:     1. Hypertension, unspecified type BP is much better Will continue lisinopril 10 mg 1 daily, has refills Encouraged to continue weight loss efforts   2. Encounter for surveillance of contraceptive pills Will continue Micronor, has refills     Plan:     Follow up in 3 months for ROS and BP check

## 2022-11-05 DIAGNOSIS — A549 Gonococcal infection, unspecified: Secondary | ICD-10-CM

## 2022-11-05 HISTORY — DX: Gonococcal infection, unspecified: A54.9

## 2022-11-10 ENCOUNTER — Other Ambulatory Visit (HOSPITAL_COMMUNITY)
Admission: RE | Admit: 2022-11-10 | Discharge: 2022-11-10 | Disposition: A | Discharge status: Home / Self Care | Payer: BC Managed Care – PPO | Source: Ambulatory Visit | Attending: Adult Health | Admitting: Adult Health

## 2022-11-10 ENCOUNTER — Encounter: Payer: Self-pay | Admitting: Adult Health

## 2022-11-10 ENCOUNTER — Ambulatory Visit (INDEPENDENT_AMBULATORY_CARE_PROVIDER_SITE_OTHER): Payer: BC Managed Care – PPO | Admitting: Adult Health

## 2022-11-10 VITALS — BP 112/80 | HR 108 | Ht 66.5 in | Wt 219.0 lb

## 2022-11-10 DIAGNOSIS — N898 Other specified noninflammatory disorders of vagina: Secondary | ICD-10-CM | POA: Insufficient documentation

## 2022-11-10 DIAGNOSIS — I10 Essential (primary) hypertension: Secondary | ICD-10-CM | POA: Diagnosis not present

## 2022-11-10 DIAGNOSIS — Z113 Encounter for screening for infections with a predominantly sexual mode of transmission: Secondary | ICD-10-CM | POA: Insufficient documentation

## 2022-11-10 DIAGNOSIS — B379 Candidiasis, unspecified: Secondary | ICD-10-CM | POA: Insufficient documentation

## 2022-11-10 MED ORDER — FLUCONAZOLE 150 MG PO TABS: ORAL_TABLET | ORAL | 1 refills | Status: DC

## 2022-11-10 NOTE — Progress Notes (Signed)
  Subjective:     Patient ID: Maria Aguilar, female   DOB: 11-01-1996, 26 y.o.   MRN: [id number]  HPI Maria Aguilar is a 26 year old black female,single, G0P0, back in for 3 month follow up on BP was given lisinopril and Micronor and she has stopped both. BP good at PCP yesterday. She is complaining of vaginal itching and itching in groin area. She is diabetic and sugars have been elevated,she says.  Last pap was negative 06/12/21  PCP is Dr Halina Andreas   Review of Systems +vaginal itching and itching in groin. Reviewed past medical,surgical, social and family history. Reviewed medications and allergies.     Objective:   Physical Exam BP 112/80 (BP Location: Left Arm, Patient Position: Sitting, Cuff Size: Normal)   Pulse (!) 108   Ht 5' 6.5" (1.689 m)   Wt 219 lb (99.3 kg)   LMP 10/15/2022 (Approximate)   BMI 34.82 kg/m   has lost 5 lbs    Skin warm and dry.Pelvic: external genitalia is normal in appearance, red and irritated labia and in groin, vagina: white discharge without odor,urethra has no lesions or masses noted, cervix:smooth, uterus: normal size, shape and contour, non tender, no masses felt, adnexa: no masses or tenderness noted. Bladder is non tender and no masses felt. CV swab obtained. Painted vulva,vagina and groin with gentian violet. Fall risk is low  Upstream - 11/10/22 1555       Pregnancy Intention Screening   Does the patient want to become pregnant in the next year? No    Does the patient's partner want to become pregnant in the next year? No    Would the patient like to discuss contraceptive options today? No      Contraception Wrap Up   Current Method Female Condom    End Method Female Condom    Contraception Counseling Provided No            Examination chaperoned by Faith Rogue LPN  Assessment:     1. Vaginal itching CV swab sent Painted with gentian violet and rx diflucan   2. Vaginal discharge CV swab sent   3. Screening examination for STD  (sexually transmitted disease) CV swab sent for GC/CHL,trich, BV and yeast Check HIV,RPR hepatitis B and C   4. Yeast infection Painted with gentian violet  Will rx diflucan  Meds ordered this encounter  Medications   fluconazole (DIFLUCAN) 150 MG tablet    Sig: Take 1 now and 1 in 3 days    Dispense:  2 tablet    Refill:  1    Order Specific Question:   Supervising Provider    Answer:   EURE, LUTHER H [[id number]]     5. Hypertension, unspecified type BP was good today Has stopped meds Keep check on BP    Plan:     Follow up 12/04/22 for recheck

## 2022-11-11 ENCOUNTER — Ambulatory Visit: Payer: BC Managed Care – PPO | Admitting: Adult Health

## 2022-11-11 LAB — HEPATITIS B SURFACE ANTIGEN: Hepatitis B Surface Ag: NEGATIVE

## 2022-11-11 LAB — HIV ANTIBODY (ROUTINE TESTING W REFLEX): HIV Screen 4th Generation wRfx: NONREACTIVE

## 2022-11-11 LAB — RPR: RPR Ser Ql: NONREACTIVE

## 2022-11-11 LAB — HEPATITIS C ANTIBODY: Hep C Virus Ab: NONREACTIVE

## 2022-11-12 LAB — CERVICOVAGINAL ANCILLARY ONLY
Bacterial Vaginitis (gardnerella): NEGATIVE
Candida Glabrata: NEGATIVE
Candida Vaginitis: POSITIVE — AB
Chlamydia: NEGATIVE
Comment: NEGATIVE
Comment: NEGATIVE
Comment: NEGATIVE
Comment: NEGATIVE
Comment: NEGATIVE
Comment: NORMAL
Neisseria Gonorrhea: POSITIVE — AB
Trichomonas: NEGATIVE

## 2022-11-15 ENCOUNTER — Encounter: Payer: Self-pay | Admitting: Adult Health

## 2022-11-15 ENCOUNTER — Telehealth: Payer: Self-pay | Admitting: Adult Health

## 2022-11-15 DIAGNOSIS — A549 Gonococcal infection, unspecified: Secondary | ICD-10-CM | POA: Insufficient documentation

## 2022-11-15 NOTE — Telephone Encounter (Signed)
Left message to call me.

## 2022-11-16 ENCOUNTER — Telehealth: Payer: Self-pay | Admitting: Adult Health

## 2022-11-16 NOTE — Telephone Encounter (Signed)
Pt aware of +GC. Will come in tomorrow for injection of rocephin and will send partner to get treated

## 2022-11-17 ENCOUNTER — Encounter: Payer: Self-pay | Admitting: *Deleted

## 2022-11-17 ENCOUNTER — Ambulatory Visit (INDEPENDENT_AMBULATORY_CARE_PROVIDER_SITE_OTHER): Payer: BC Managed Care – PPO | Admitting: *Deleted

## 2022-11-17 DIAGNOSIS — A549 Gonococcal infection, unspecified: Secondary | ICD-10-CM

## 2022-11-17 MED ORDER — CEFTRIAXONE SODIUM 500 MG IJ SOLR
500.0000 mg | Freq: Once | INTRAMUSCULAR | Status: AC
  Administered 2022-11-17: 500 mg via INTRAMUSCULAR

## 2022-11-17 NOTE — Progress Notes (Signed)
   NURSE VISIT- INJECTION  SUBJECTIVE:  Maria Aguilar is a 26 y.o. G0P0000 female here for a Rocephin for treatment for gonorrhea. She is a GYN patient.   OBJECTIVE:  LMP 10/15/2022 (Approximate)   Appears well, in no apparent distress  Injection administered in: Right upper quad. gluteus  Meds ordered this encounter  Medications   cefTRIAXone (ROCEPHIN) injection 500 mg    ASSESSMENT: GYN patient Rocephin for treatment for gonorrhea PLAN: Follow-up: as scheduled   Malachy Mood  11/17/2022 11:46 AM

## 2022-12-03 ENCOUNTER — Ambulatory Visit: Payer: BC Managed Care – PPO | Admitting: Adult Health

## 2022-12-03 ENCOUNTER — Encounter: Payer: Self-pay | Admitting: Adult Health

## 2022-12-03 ENCOUNTER — Other Ambulatory Visit (HOSPITAL_COMMUNITY)
Admission: RE | Admit: 2022-12-03 | Discharge: 2022-12-03 | Disposition: A | Discharge status: Home / Self Care | Payer: BC Managed Care – PPO | Source: Ambulatory Visit | Attending: Adult Health | Admitting: Adult Health

## 2022-12-03 VITALS — BP 115/80 | HR 94 | Ht 66.5 in | Wt 227.0 lb

## 2022-12-03 DIAGNOSIS — B379 Candidiasis, unspecified: Secondary | ICD-10-CM | POA: Diagnosis present

## 2022-12-03 DIAGNOSIS — A549 Gonococcal infection, unspecified: Secondary | ICD-10-CM | POA: Diagnosis present

## 2022-12-03 DIAGNOSIS — Z09 Encounter for follow-up examination after completed treatment for conditions other than malignant neoplasm: Secondary | ICD-10-CM | POA: Diagnosis present

## 2022-12-03 NOTE — Progress Notes (Signed)
  Subjective:     Patient ID: Maria Aguilar, female   DOB: Apr 26, 1996, 26 y.o.   MRN: [id number]  HPI Maria Aguilar is a 26 year old black female,single, G0P0, in for proof of cure for +GC, and she had yeast too. No sex since treatment.  Last pap was negative HPV and malignancy 06/12/21.  PCP is Dr Halina Andreas.  Review of Systems No complaints,no sex since treatment, on period Reviewed past medical,surgical, social and family history. Reviewed medications and allergies.     Objective:   Physical Exam BP 115/80 (BP Location: Left Arm, Patient Position: Sitting, Cuff Size: Normal)   Pulse 94   Ht 5' 6.5" (1.689 m)   Wt 227 lb (103 kg)   LMP 11/24/2022 (Approximate)   BMI 36.09 kg/m     Skin warm and dry.Pelvic: external genitalia is normal in appearance no lesions, vagina: +period blood,urethra has no lesions or masses noted, cervix:smooth, uterus: normal size, shape and contour, non tender, no masses felt, adnexa: no masses or tenderness noted. Bladder is non tender and no masses felt.  CV swab obtained.  Upstream - 12/03/22 1234       Pregnancy Intention Screening   Does the patient want to become pregnant in the next year? No    Does the patient's partner want to become pregnant in the next year? No    Would the patient like to discuss contraceptive options today? No      Contraception Wrap Up   Current Method Female Condom    End Method Female Condom    Contraception Counseling Provided No            Examination chaperoned by Federico Flake CMA  Assessment:     1. Gonorrhea No sex since treatment CV swab sent   2. Yeast infection CV swab sent  3. Follow-up treatment CV swab sent for GC/CHL, and yeast     Plan:     Follow up prn

## 2022-12-04 ENCOUNTER — Other Ambulatory Visit: Payer: Self-pay | Admitting: Adult Health

## 2022-12-07 LAB — CERVICOVAGINAL ANCILLARY ONLY
Candida Glabrata: NEGATIVE
Candida Vaginitis: NEGATIVE
Chlamydia: NEGATIVE
Comment: NEGATIVE
Comment: NEGATIVE
Comment: NEGATIVE
Comment: NORMAL
Neisseria Gonorrhea: NEGATIVE

## 2022-12-13 ENCOUNTER — Ambulatory Visit: Payer: BC Managed Care – PPO | Admitting: Adult Health

## 2022-12-24 ENCOUNTER — Other Ambulatory Visit: Payer: Self-pay | Admitting: Adult Health

## 2023-01-30 ENCOUNTER — Other Ambulatory Visit: Payer: Self-pay | Admitting: Adult Health

## 2023-02-03 ENCOUNTER — Encounter: Payer: Self-pay | Admitting: Radiology

## 2023-03-15 ENCOUNTER — Other Ambulatory Visit (HOSPITAL_COMMUNITY)
Admission: RE | Admit: 2023-03-15 | Discharge: 2023-03-15 | Disposition: A | Discharge status: Home / Self Care | Payer: BC Managed Care – PPO | Source: Ambulatory Visit | Attending: Adult Health | Admitting: Adult Health

## 2023-03-15 ENCOUNTER — Encounter: Payer: Self-pay | Admitting: Adult Health

## 2023-03-15 ENCOUNTER — Ambulatory Visit (INDEPENDENT_AMBULATORY_CARE_PROVIDER_SITE_OTHER): Payer: BC Managed Care – PPO | Admitting: Adult Health

## 2023-03-15 VITALS — BP 126/84 | HR 85 | Ht 66.0 in | Wt 217.0 lb

## 2023-03-15 DIAGNOSIS — N898 Other specified noninflammatory disorders of vagina: Secondary | ICD-10-CM | POA: Insufficient documentation

## 2023-03-15 DIAGNOSIS — Z113 Encounter for screening for infections with a predominantly sexual mode of transmission: Secondary | ICD-10-CM | POA: Insufficient documentation

## 2023-03-15 NOTE — Progress Notes (Signed)
  Subjective:     Patient ID: Maria Aguilar, female   DOB: 1996/08/18, 27 y.o.   MRN: [id number]  HPI Reylynn is a 27 year old black female,single, G0P0, in complaining of vaginal odor and discharge and some itching, seems to have recurrent yeast.  Last pap was negative 06/12/21.  PCP at [facility]  Review of Systems +vaginal odor +vaginal discharge +some vaginal itching Reviewed past medical,surgical, social and family history. Reviewed medications and allergies.     Objective:   Physical Exam BP 126/84 (BP Location: Left Arm, Patient Position: Sitting, Cuff Size: Large)   Pulse 85   Ht 5\' 6"  (1.676 m)   Wt 217 lb (98.4 kg)   LMP 03/07/2023   BMI 35.02 kg/m     Skin warm and dry.Pelvic: external genitalia is normal in appearance no lesions, vagina: white,frothy discharge with odor,urethra has no lesions or masses noted, cervix:smooth, uterus: normal size, shape and contour, non tender, no masses felt, adnexa: no masses or tenderness noted. Bladder is non tender and no masses felt.CV swab obtained. Fall risk is low  Upstream - 03/15/23 1140       Pregnancy Intention Screening   Does the patient want to become pregnant in the next year? No    Does the patient's partner want to become pregnant in the next year? No    Would the patient like to discuss contraceptive options today? No      Contraception Wrap Up   Current Method Abstinence    End Method Abstinence;Withdrawal or Other Method            Examination chaperoned by Malachy Mood LPN  Assessment:     1. Vaginal odor + vaginal odor CV swab sent for GC/CHL, trich, BV and yeast  - Cervicovaginal ancillary only( [place])  2. Vaginal discharge +white frothy discharge  CV swab sent  - Cervicovaginal ancillary only( [place])  3. Vaginal itching Has some itching esp before periods CV swab sent  - Cervicovaginal ancillary only( [place])  4. Screening examination for STD (sexually transmitted  disease) CV swab sent for GC/CHL,trich - Cervicovaginal ancillary only( [place])     Plan:     Follow up prn

## 2023-03-16 ENCOUNTER — Other Ambulatory Visit: Payer: Self-pay | Admitting: Adult Health

## 2023-03-16 LAB — CERVICOVAGINAL ANCILLARY ONLY
Bacterial Vaginitis (gardnerella): POSITIVE — AB
Candida Glabrata: NEGATIVE
Candida Vaginitis: NEGATIVE
Chlamydia: NEGATIVE
Comment: NEGATIVE
Comment: NEGATIVE
Comment: NEGATIVE
Comment: NEGATIVE
Comment: NEGATIVE
Comment: NORMAL
Neisseria Gonorrhea: NEGATIVE
Trichomonas: NEGATIVE

## 2023-03-16 MED ORDER — METRONIDAZOLE 500 MG PO TABS: 500.0000 mg | ORAL_TABLET | Freq: Two times a day (BID) | ORAL | 0 refills | Status: DC

## 2023-03-16 NOTE — Progress Notes (Signed)
Vaginal swab was positive for BV, which is change in PH in vagina, will rx flagyl, no sex or alcohol while taking

## 2023-04-14 ENCOUNTER — Ambulatory Visit: Payer: BC Managed Care – PPO | Admitting: Adult Health

## 2023-05-06 ENCOUNTER — Other Ambulatory Visit (HOSPITAL_COMMUNITY)
Admission: RE | Admit: 2023-05-06 | Discharge: 2023-05-06 | Disposition: A | Discharge status: Home / Self Care | Payer: BC Managed Care – PPO | Source: Ambulatory Visit | Attending: Obstetrics & Gynecology | Admitting: Obstetrics & Gynecology

## 2023-05-06 ENCOUNTER — Ambulatory Visit (INDEPENDENT_AMBULATORY_CARE_PROVIDER_SITE_OTHER): Payer: BC Managed Care – PPO | Admitting: *Deleted

## 2023-05-06 DIAGNOSIS — N898 Other specified noninflammatory disorders of vagina: Secondary | ICD-10-CM

## 2023-05-06 NOTE — Progress Notes (Signed)
   NURSE VISIT- VAGINITIS  SUBJECTIVE:  Maria Aguilar is a 27 y.o. G0P0000 GYN patientfemale here for a vaginal swab for vaginitis screening.  She reports the following symptoms: discharge described as grey, odor, and vulvar itching for 2 days. Denies abnormal vaginal bleeding, significant pelvic pain, fever, or UTI symptoms.  OBJECTIVE:  There were no vitals taken for this visit.  Appears well, in no apparent distress  ASSESSMENT: Vaginal swab for vaginitis screening  PLAN: Self-collected vaginal probe for Gonorrhea, Chlamydia, Trichomonas, Bacterial Vaginosis, Yeast sent to lab Treatment: to be determined once results are received Follow-up as needed if symptoms persist/worsen, or new symptoms develop  Jobe Marker  05/06/2023 11:43 AM

## 2023-05-09 LAB — CERVICOVAGINAL ANCILLARY ONLY
Bacterial Vaginitis (gardnerella): POSITIVE — AB
Candida Glabrata: NEGATIVE
Candida Vaginitis: POSITIVE — AB
Chlamydia: NEGATIVE
Comment: NEGATIVE
Comment: NEGATIVE
Comment: NEGATIVE
Comment: NEGATIVE
Comment: NEGATIVE
Comment: NORMAL
Neisseria Gonorrhea: NEGATIVE
Trichomonas: NEGATIVE

## 2023-05-10 ENCOUNTER — Other Ambulatory Visit: Payer: Self-pay | Admitting: Adult Health

## 2023-05-10 MED ORDER — FLUCONAZOLE 150 MG PO TABS: ORAL_TABLET | ORAL | 1 refills | Status: DC

## 2023-05-10 MED ORDER — METRONIDAZOLE 500 MG PO TABS: 500.0000 mg | ORAL_TABLET | Freq: Two times a day (BID) | ORAL | 0 refills | Status: DC

## 2023-05-10 NOTE — Progress Notes (Signed)
+  BV and yeast on vaginal swab, will rx flagyl and diflucan, no sex or alcohol while taking  

## 2023-05-12 ENCOUNTER — Ambulatory Visit: Payer: BC Managed Care – PPO | Admitting: Adult Health

## 2023-05-19 ENCOUNTER — Telehealth (INDEPENDENT_AMBULATORY_CARE_PROVIDER_SITE_OTHER): Payer: BC Managed Care – PPO | Admitting: Adult Health

## 2023-05-19 ENCOUNTER — Encounter: Payer: Self-pay | Admitting: Adult Health

## 2023-05-19 VITALS — Ht 66.0 in

## 2023-05-19 DIAGNOSIS — B379 Candidiasis, unspecified: Secondary | ICD-10-CM | POA: Diagnosis not present

## 2023-05-19 DIAGNOSIS — B9689 Other specified bacterial agents as the cause of diseases classified elsewhere: Secondary | ICD-10-CM | POA: Insufficient documentation

## 2023-05-19 DIAGNOSIS — N760 Acute vaginitis: Secondary | ICD-10-CM | POA: Insufficient documentation

## 2023-05-19 NOTE — Progress Notes (Signed)
Patient ID: Maria Aguilar, female   DOB: May 27, 1996, 27 y.o.   MRN: [id number]   TELEHEALTH GYNECOLOGY VISIT ENCOUNTER NOTE  Provider location: [facility]   Patient location: Home  I connected with Maria Aguilar on 05/19/23 at  1:50 PM EDT by telephone and verified that I am speaking with the correct person using two identifiers. Patient was unable to do MyChart audiovisual encounter due to technical difficulties, she tried several times.    I discussed the limitations, risks, security and privacy concerns of performing an evaluation and management service by telephone and the availability of in person appointments. I also discussed with the patient that there may be a patient responsible charge related to this service. The patient expressed understanding and agreed to proceed.   History:  Maria Aguilar is a 27 y.o. G0P0000 female being evaluated today for recurrent yeast and BV, notices itching before periods and odor after period. +BV and yeast, on vaginal swab, 05/06/23, treated with diflucan and flagyl and she has used boric acid too. She denies any  bleeding, pelvic pain or other concerns.       Past Medical History:  Diagnosis Date   Depression    Diabetes mellitus without complication (HCC)    Gonorrhea 11/2022   Hydradenitis    Hypertension    Past Surgical History:  Procedure Laterality Date   WISDOM TOOTH EXTRACTION     The following portions of the patient's history were reviewed and updated as appropriate: allergies, current medications, past family history, past medical history, past social history, past surgical history and problem list.   Health Maintenance:  Normal pap on 06/12/21  Review of Systems:  Pertinent items noted in HPI and remainder of comprehensive ROS otherwise negative.  Physical Exam:   General:  Alert, oriented and cooperative.   Mental Status: Normal mood and affect perceived. Normal judgment and thought  content.  Physical exam deferred due to nature of the encounter Ht 5\' 6"  (1.676 m)   LMP 05/07/2023   BMI 35.02 kg/m   Fall risk is low  Upstream - 05/19/23 1359       Pregnancy Intention Screening   Does the patient want to become pregnant in the next year? No    Does the patient's partner want to become pregnant in the next year? No      Contraception Wrap Up   Current Method Withdrawal or Other Method    End Method Withdrawal or Other Method             Labs and Imaging Results for orders placed or performed in visit on 05/06/23 (from the past 336 hour(s))  Cervicovaginal ancillary only   Collection Time: 05/06/23 11:43 AM  Result Value Ref Range   Neisseria Gonorrhea Negative    Chlamydia Negative    Trichomonas Negative    Bacterial Vaginitis (gardnerella) Positive (A)    Candida Vaginitis Positive (A)    Candida Glabrata Negative    Comment      Normal Reference Range Bacterial Vaginosis - Negative   Comment Normal Reference Ranger Chlamydia - Negative    Comment      Normal Reference Range Neisseria Gonorrhea - Negative   Comment Normal Reference Range Candida Species - Negative    Comment Normal Reference Range Candida Galbrata - Negative    Comment Normal Reference Range Trichomonas - Negative    No results found.    Assessment and Plan:      1.  Yeast infection Has been treated with diflucan Try to get blood sugars under control  2. BV (bacterial vaginosis) Has beedn treated, can get POC self swab for BV and yeast, has not had sex she says Discussed trying long term therapy if needed       I discussed the assessment and treatment plan with the patient. The patient was provided an opportunity to ask questions and all were answered. The patient agreed with the plan and demonstrated an understanding of the instructions.   The patient was advised to call back or seek an in-person evaluation/go to the ED if the symptoms worsen or if the condition fails to  improve as anticipated.  I provided 10  minutes of non-face-to-face time during this encounter.   Cyril Mourning, NP [facility]

## 2023-05-25 ENCOUNTER — Other Ambulatory Visit (INDEPENDENT_AMBULATORY_CARE_PROVIDER_SITE_OTHER): Payer: BC Managed Care – PPO | Admitting: *Deleted

## 2023-05-25 ENCOUNTER — Other Ambulatory Visit (HOSPITAL_COMMUNITY)
Admission: RE | Admit: 2023-05-25 | Discharge: 2023-05-25 | Disposition: A | Discharge status: Home / Self Care | Payer: BC Managed Care – PPO | Source: Ambulatory Visit | Attending: Obstetrics & Gynecology | Admitting: Obstetrics & Gynecology

## 2023-05-25 DIAGNOSIS — B379 Candidiasis, unspecified: Secondary | ICD-10-CM

## 2023-05-25 NOTE — Progress Notes (Signed)
   NURSE VISIT- VAGINITIS/STD/POC  SUBJECTIVE:  Maria Aguilar is a 27 y.o. G0P0000 GYN patientfemale here for a vaginal swab for vaginitis screening.  She reports the following symptoms: discharge described as white for several  days. Denies abnormal vaginal bleeding, significant pelvic pain, fever, or UTI symptoms.  OBJECTIVE:  LMP 05/07/2023   Appears well, in no apparent distress  ASSESSMENT: Vaginal swab for vaginitis screening  PLAN: Self-collected vaginal probe for Bacterial Vaginosis, Yeast sent to lab Treatment: to be determined once results are received Follow-up as needed if symptoms persist/worsen, or new symptoms develop  Annamarie Dawley  05/25/2023 4:05 PM

## 2023-05-27 LAB — CERVICOVAGINAL ANCILLARY ONLY
Bacterial Vaginitis (gardnerella): NEGATIVE
Candida Glabrata: NEGATIVE
Candida Vaginitis: NEGATIVE
Comment: NEGATIVE
Comment: NEGATIVE
Comment: NEGATIVE

## 2023-06-16 ENCOUNTER — Other Ambulatory Visit: Payer: BC Managed Care – PPO

## 2023-07-05 ENCOUNTER — Other Ambulatory Visit: Payer: Self-pay | Admitting: Adult Health

## 2023-08-17 ENCOUNTER — Ambulatory Visit (INDEPENDENT_AMBULATORY_CARE_PROVIDER_SITE_OTHER): Payer: BC Managed Care – PPO | Admitting: Advanced Practice Midwife

## 2023-08-17 ENCOUNTER — Encounter: Payer: Self-pay | Admitting: Advanced Practice Midwife

## 2023-08-17 ENCOUNTER — Other Ambulatory Visit (HOSPITAL_COMMUNITY)
Admission: RE | Admit: 2023-08-17 | Discharge: 2023-08-17 | Disposition: A | Discharge status: Home / Self Care | Payer: BC Managed Care – PPO | Source: Ambulatory Visit | Attending: Women's Health | Admitting: Women's Health

## 2023-08-17 VITALS — BP 114/86 | HR 98 | Ht 66.0 in | Wt 215.0 lb

## 2023-08-17 DIAGNOSIS — Z8619 Personal history of other infectious and parasitic diseases: Secondary | ICD-10-CM | POA: Insufficient documentation

## 2023-08-17 DIAGNOSIS — N949 Unspecified condition associated with female genital organs and menstrual cycle: Secondary | ICD-10-CM | POA: Diagnosis present

## 2023-08-17 DIAGNOSIS — Z113 Encounter for screening for infections with a predominantly sexual mode of transmission: Secondary | ICD-10-CM | POA: Insufficient documentation

## 2023-08-17 DIAGNOSIS — B3731 Acute candidiasis of vulva and vagina: Secondary | ICD-10-CM | POA: Diagnosis not present

## 2023-08-17 NOTE — Progress Notes (Signed)
   GYN VISIT Patient name: Maria Aguilar MRN [id number]  Date of birth: 07/02/1996 Chief Complaint:   vaginal discomfort (dryness)  History of Present Illness:   Maria Aguilar is a 27 y.o. G0P0000 African-American female being seen today for vag discomfort- tender; no odor; reports thin white d/c without itching. 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  Takes Loestrin for cycle control.  Patient's last menstrual period was 05/19/2023. The current method of family planning is OCP (estrogen/progesterone).  Last pap July 2022. Results were: NILM w/ HRHPV not done     06/23/2022    2:53 PM 06/12/2021    1:15 PM 01/26/2019   12:00 PM 07/19/2018    3:42 PM 06/03/2017    2:34 PM  Depression screen PHQ 2/9  Decreased Interest 0 0 2 0 0  Down, Depressed, Hopeless 0 0 2 0 0  PHQ - 2 Score 0 0 4 0 0  Altered sleeping 0 3 3  0  Tired, decreased energy 1 1 3   0  Change in appetite 0 1 3  0  Feeling bad or failure about yourself  0 0 1  0  Trouble concentrating 0 2 0  0  Moving slowly or fidgety/restless 0 0 0  0  Suicidal thoughts 0 0 0  0  PHQ-9 Score 1 7 14   0  Difficult doing work/chores   Very difficult  Not difficult at all        06/23/2022    2:54 PM 06/12/2021    1:15 PM  GAD 7 : Generalized Anxiety Score  Nervous, Anxious, on Edge 0 0  Control/stop worrying 0 0  Worry too much - different things 0 0  Trouble relaxing 0 2  Restless 0 0  Easily annoyed or irritable 1 0  Afraid - awful might happen 0 0  Total GAD 7 Score 1 2     Review of Systems:   Pertinent items are noted in HPI Denies fever/chills, dizziness, headaches, visual disturbances, fatigue, shortness of breath, chest pain, abdominal pain, vomiting, abnormal vaginal discharge/itching/odor/irritation, problems with periods, bowel movements, urination, or intercourse unless otherwise stated above.  Pertinent History Reviewed:  Reviewed past medical,surgical, social, obstetrical and family  history.  Reviewed problem list, medications and allergies. Physical Assessment:   Vitals:   08/17/23 1006  BP: 114/86  Pulse: 98  Weight: 215 lb (97.5 kg)  Height: 5\' 6"  (1.676 m)  Body mass index is 34.7 kg/m.       Physical Examination:   General appearance: alert, well appearing, and in no distress  Mental status: alert, oriented to person, place, and time  Skin: warm & dry   Cardiovascular: normal heart rate noted  Respiratory: normal respiratory effort, no distress  Abdomen: soft, non-tender   Pelvic: normal external genitalia, vulva, vagina, cervix, uterus and adnexa  Extremities: no edema    No results found for this or any previous visit (from the past 24 hour(s)).  Assessment & Plan:  1) Vaginal d/c and discomfort> CV swab collected and sent; also recommended condoms due to freq BV to help maintain vag pH   Meds: No orders of the defined types were placed in this encounter.   No orders of the defined types were placed in this encounter.   Return for Pap & Physical July 2025 w JG.  Arabella Merles CNM 08/17/2023 10:45 AM

## 2023-08-18 ENCOUNTER — Ambulatory Visit: Payer: BC Managed Care – PPO | Admitting: Women's Health

## 2023-08-18 LAB — CERVICOVAGINAL ANCILLARY ONLY
Bacterial Vaginitis (gardnerella): NEGATIVE
Candida Glabrata: POSITIVE — AB
Candida Vaginitis: NEGATIVE
Chlamydia: NEGATIVE
Comment: NEGATIVE
Comment: NEGATIVE
Comment: NEGATIVE
Comment: NEGATIVE
Comment: NEGATIVE
Comment: NORMAL
Neisseria Gonorrhea: NEGATIVE
Trichomonas: NEGATIVE

## 2023-09-20 ENCOUNTER — Ambulatory Visit (HOSPITAL_COMMUNITY)
Admission: EM | Admit: 2023-09-20 | Discharge: 2023-09-20 | Disposition: A | Discharge status: Home / Self Care | Payer: No Payment, Other | Attending: Psychiatry | Admitting: Psychiatry

## 2023-09-20 ENCOUNTER — Other Ambulatory Visit: Payer: Self-pay

## 2023-09-20 ENCOUNTER — Encounter (HOSPITAL_COMMUNITY): Payer: Self-pay | Admitting: Emergency Medicine

## 2023-09-20 ENCOUNTER — Emergency Department (HOSPITAL_COMMUNITY)
Admission: EM | Admit: 2023-09-20 | Discharge: 2023-09-20 | Discharge status: Against Medical Advice | Payer: Medicaid Other | Attending: Emergency Medicine | Admitting: Emergency Medicine

## 2023-09-20 DIAGNOSIS — R4589 Other symptoms and signs involving emotional state: Secondary | ICD-10-CM

## 2023-09-20 DIAGNOSIS — F1290 Cannabis use, unspecified, uncomplicated: Secondary | ICD-10-CM | POA: Insufficient documentation

## 2023-09-20 DIAGNOSIS — Z5321 Procedure and treatment not carried out due to patient leaving prior to being seen by health care provider: Secondary | ICD-10-CM | POA: Diagnosis not present

## 2023-09-20 DIAGNOSIS — F32A Depression, unspecified: Secondary | ICD-10-CM | POA: Diagnosis present

## 2023-09-20 DIAGNOSIS — F419 Anxiety disorder, unspecified: Secondary | ICD-10-CM | POA: Insufficient documentation

## 2023-09-20 DIAGNOSIS — Z566 Other physical and mental strain related to work: Secondary | ICD-10-CM | POA: Insufficient documentation

## 2023-09-20 DIAGNOSIS — F339 Major depressive disorder, recurrent, unspecified: Secondary | ICD-10-CM | POA: Insufficient documentation

## 2023-09-20 DIAGNOSIS — F410 Panic disorder [episodic paroxysmal anxiety] without agoraphobia: Secondary | ICD-10-CM | POA: Insufficient documentation

## 2023-09-20 NOTE — ED Notes (Signed)
Pt left bc they did not want to wait

## 2023-09-20 NOTE — Discharge Instructions (Addendum)
F/u outpatient services and or walk-in psychiatry

## 2023-09-20 NOTE — ED Triage Notes (Signed)
Patient arrives ambulatory by POV with mother c/o feeling very anxious and depressed. Patient states she is not suicidal or homicidal however having feelings of wanting to give up. Patient states she stopped taking her medications for diabetes. Reports lots of stress at work. States she has tried to get her psychiatrist to fill out paperwork about her mental health for work but refuses to so she is having trouble getting money for food. Reports frequent urination from her diabetes.

## 2023-09-20 NOTE — Progress Notes (Signed)
   09/20/23 1847  [facility] Triage Screening (Walk-ins at [facility] only)  How Did You Hear About [place]? Family/Friend  What Is the Reason for Your Visit/Call Today? Maria Aguilar is a 27 year old female who presents voluntarily to [facility] accompanied by her mother due to increased panic attacks. Pt reports crying spells, agitation, unstable sleeping patterns, passive SI, racing thoughts, decreased appetite. Pt reports she sees a therapist 1x a week at [facility]. Pt states "I just want to stop feeling like something is wrong with me ". Pt states her medication is being managed by [facility] and her last visit was 10/4. Pt reports being diagnosed with depression/anxiety and is prescribed Abilify, sertraline and hydroxyzine. Pt reports she lives with her parents and her brother. Pt denies SI/HI and AVH currently.  How Long Has This Been Causing You Problems? <Week  Have You Recently Had Any Thoughts About Hurting Yourself? Yes  How long ago did you have thoughts about hurting yourself? this week  Are You Planning to Commit Suicide/Harm Yourself At This time? No  Have you Recently Had Thoughts About Hurting Someone Karolee Ohs? No  Are You Planning To Harm Someone At This Time? No  Are you currently experiencing any auditory, visual or other hallucinations? No  Have You Used Any Alcohol or Drugs in the Past 24 Hours? Yes  How long ago did you use Drugs or Alcohol? yesterday  What Did You Use and How Much? marijuana  Do you have any current medical co-morbidities that require immediate attention? No  Clinician description of patient physical appearance/behavior: calm, cooperative, casually dressed  What Do You Feel Would Help You the Most Today? Treatment for Depression or other mood problem  If access to [facility] was not available, would you have sought care in the Emergency Department? No  Determination of Need Routine (7 days)  Options For Referral Outpatient Therapy;Medication Management

## 2023-09-20 NOTE — ED Provider Notes (Signed)
Behavioral Health Urgent Care Medical Screening Exam  Patient Name: Maria Aguilar MRN: [id number] Date of Evaluation: 09/21/23 Chief Complaint:  increase panic attack Diagnosis:  Final diagnoses:  Recurrent major depressive disorder, remission status unspecified (HCC)  Panic attack  Anxious appearance    History of Present illness: Maria Aguilar is a 27 y.o. female. With a history of MDD,  panic attack,  and anxiety presented to [facility] voluntarily accompanied by her mother.  Per the patient she has been having panic attacks basically every day.  When asked if she has a provide the patient stated yes she goes to [employer] however patient stated that the gap he will not see her anymore because she wanted them to fill out disability papers and they told her now they are not doing that.  Patient reported that she having increased stress at work does not take her medicine anymore because she does not trust them.  Per the patient she last took her medicine in May which were Abilify 5 mg Zoloft 50 mg and hydroxyzine 25-minute.  Patient denies any prior hospitalization for any psychiatric problems.  Patient stated she works part-time right now but she is looking for somebody to fill out paperwork so she can get disability.  Patient does not appear to have any disability however that is what patient is requesting  Face-to-face observation of patient, patient is alert and oriented x 4, speech is clear maintain eye.  Patient is observed in the room sitting down with her mother.  And ask a question patient can be very defensive and does appear to be on the attack for the simplest questions asked.  Discussed with patient that she needs to be more receptive and this is the way she is going to get things done.  Patient denies SI, HI, AVH or paranoia.  Patient reports she smoked marijuana on a daily basis.  Patient reports she does see a therapist at [facility].  Patient does appear to be seeking secondary  gain when it comes onto trying to get disability paperwork filled out.  Patient does not seem to be influenced by external or internal stimuli and does not appear to be a threat to herself or others.  Patient is fixated on wanting somebody to fill out paperwork for disability citing that she just do not want to work there.  Even when discussing with patient the need to stop marijuana use and to resume her medication patient is fixated on she gets her marijuana from [place] so her marijuana is a good marijuana.   Writer gave patient resources to outpatient services and also gave patient information to walk-in psychiatry.  Recommend discharge with patient to follow-up with either walk-in psychiatry or to follow up with outpatient resources provided to her .  Flowsheet Row ED from 09/20/2023 in [facility] Most recent reading at 09/20/2023  7:05 PM ED from 09/20/2023 in [facility] Emergency Department at [facility] Most recent reading at 09/20/2023  5:25 PM ED from 06/18/2021 in [facility] Emergency Department at [facility] Most recent reading at 06/18/2021 10:41 PM  C-SSRS RISK CATEGORY No Risk Moderate Risk No Risk       Psychiatric Specialty Exam  Presentation  General Appearance:Casual  Eye Contact:Good  Speech:Clear and Coherent  Speech Volume:Normal  Handedness:Right   Mood and Affect  Mood: Anxious  Affect: Blunt   Thought Process  Thought Processes: Coherent  Descriptions of Associations:Intact  Orientation:Full (Time, Place and  Person)  Thought Content:Logical    Hallucinations:None  Ideas of Reference:None  Suicidal Thoughts:No  Homicidal Thoughts:No   Sensorium  Memory: Immediate Fair  Judgment: Fair  Insight: Good   Executive Functions  Concentration: Good  Attention Span: Good  Recall: Good  Fund of Knowledge: Good  Language: Good   Psychomotor Activity  Psychomotor  Activity: Normal   Assets  Assets: Desire for Improvement   Sleep  Sleep: Fair  Number of hours:  6   Physical Exam: Physical Exam HENT:     Head: Normocephalic.     Nose: Nose normal.  Eyes:     Pupils: Pupils are equal, round, and reactive to light.  Cardiovascular:     Rate and Rhythm: Tachycardia present.  Pulmonary:     Effort: Pulmonary effort is normal.  Musculoskeletal:        General: Normal range of motion.     Cervical back: Normal range of motion.  Skin:    General: Skin is warm.  Neurological:     General: No focal deficit present.     Mental Status: She is alert.  Psychiatric:        Mood and Affect: Mood normal.        Behavior: Behavior normal.        Thought Content: Thought content normal.        Judgment: Judgment normal.    Review of Systems  Constitutional: Negative.   HENT: Negative.    Eyes: Negative.   Respiratory: Negative.    Cardiovascular: Negative.   Gastrointestinal: Negative.   Genitourinary: Negative.   Musculoskeletal: Negative.   Skin: Negative.   Neurological: Negative.   Psychiatric/Behavioral:  Positive for depression and substance abuse. The patient is nervous/anxious.    Blood pressure (!) 126/92, pulse (!) 102, temperature 98.2 F (36.8 C), temperature source Oral, resp. rate 18, last menstrual period 09/13/2023, SpO2 100%. There is no height or weight on file to calculate BMI.  Musculoskeletal: Strength & Muscle Tone: within normal limits Gait & Station: normal Patient leans: N/A   [facility] MSE Discharge Disposition for Follow up and Recommendations: Based on my evaluation the patient does not appear to have an emergency medical condition and can be discharged with resources and follow up care in outpatient services for Medication Management, Substance Abuse Intensive Outpatient Program, and Individual Therapy   Sindy Guadeloupe, NP 09/21/2023, 5:51 AM

## 2023-12-12 ENCOUNTER — Other Ambulatory Visit: Payer: Medicaid Other

## 2023-12-12 ENCOUNTER — Other Ambulatory Visit: Payer: Self-pay | Admitting: Adult Health

## 2023-12-12 MED ORDER — METRONIDAZOLE 500 MG PO TABS: 500.0000 mg | ORAL_TABLET | Freq: Two times a day (BID) | ORAL | 0 refills | Status: DC

## 2023-12-12 NOTE — Progress Notes (Signed)
 Refilled flagyl

## 2023-12-13 ENCOUNTER — Telehealth: Payer: Medicaid Other

## 2023-12-20 ENCOUNTER — Ambulatory Visit: Payer: Medicaid Other | Admitting: Nurse Practitioner

## 2023-12-20 ENCOUNTER — Encounter: Payer: Self-pay | Admitting: Nurse Practitioner

## 2023-12-20 VITALS — BP 110/80 | HR 99 | Temp 98.2°F | Ht 66.0 in | Wt 202.8 lb

## 2023-12-20 DIAGNOSIS — Z113 Encounter for screening for infections with a predominantly sexual mode of transmission: Secondary | ICD-10-CM

## 2023-12-20 DIAGNOSIS — R202 Paresthesia of skin: Secondary | ICD-10-CM

## 2023-12-20 DIAGNOSIS — I10 Essential (primary) hypertension: Secondary | ICD-10-CM | POA: Diagnosis not present

## 2023-12-20 DIAGNOSIS — Z2821 Immunization not carried out because of patient refusal: Secondary | ICD-10-CM

## 2023-12-20 DIAGNOSIS — Z6832 Body mass index (BMI) 32.0-32.9, adult: Secondary | ICD-10-CM

## 2023-12-20 DIAGNOSIS — Z01419 Encounter for gynecological examination (general) (routine) without abnormal findings: Secondary | ICD-10-CM

## 2023-12-20 DIAGNOSIS — F4323 Adjustment disorder with mixed anxiety and depressed mood: Secondary | ICD-10-CM

## 2023-12-20 DIAGNOSIS — Z7689 Persons encountering health services in other specified circumstances: Secondary | ICD-10-CM

## 2023-12-20 DIAGNOSIS — Z114 Encounter for screening for human immunodeficiency virus [HIV]: Secondary | ICD-10-CM

## 2023-12-20 DIAGNOSIS — E66811 Obesity, class 1: Secondary | ICD-10-CM

## 2023-12-20 DIAGNOSIS — E6609 Other obesity due to excess calories: Secondary | ICD-10-CM

## 2023-12-20 DIAGNOSIS — E1165 Type 2 diabetes mellitus with hyperglycemia: Secondary | ICD-10-CM | POA: Diagnosis not present

## 2023-12-20 MED ORDER — MAGNESIUM GLYCINATE 120 MG PO CAPS: 2.0000 | ORAL_CAPSULE | Freq: Every evening | ORAL | 1 refills | Status: DC

## 2023-12-20 NOTE — Progress Notes (Signed)
 Maria Aguilar, CMA,acting as a neurosurgeon for Gaines Ada, FNP.,have documented all relevant documentation on the behalf of Gaines Ada, FNP,as directed by  Gaines Ada, FNP while in the presence of Gaines Ada, FNP.  Subjective:  Patient ID: Maria Aguilar , female    DOB: [birth year] , 28 y.o.   MRN: [id number]  Chief Complaint  Patient presents with   Establish Care    HPI  Patient presents today to establish care follow up, was going to [facility] last seen in October through [facility].  She found us  online. 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 She is single, no children. She went to [employer].   Patient reports compliance with medication. She reports she is not taking lisinopril  for HTN thought to be related to her birth control. She has been diagnosed with diabetes since 2017. Has been as high as 14. She has been on tresiba  35 units and humalog  sliding scale. She has been on ozempic  and trulicity caused her the GI symptoms. She has ended up in the hospital due to dehydration. She has also taken Janumet  initially had diarrhea until she got used to it. She did not see a difference with what she ate and her GI symptoms with GLP1s. She was started on Omnipod but does not know how to work it - she is unsure if she signed herself or if ordered her.   Patient denies any chest pain, SOB, or headaches. Patient reports she would like to discuss her diabetes and her anxiety. Patient reports she hasn't been taking her diabetes medication or her anxiety medication.   Patient reports she has also has PCOS but she does have a dermatologist (plans to stay at [facility]) and ob/gyn. Patient reports she doesn't have hypertension she had a few BP readings but she thinks it was due to her mental health and birth control. Patient would like her birth control managed here until seen by a new OBGYN.   Patient reports she is having numbness in her right hand  fingertips.   She was seeing psychiatrist at [facility]. She has taken hydroxyzine and escitalopram but not taking these medications due to having headaches. She did not feel like it was working would have episodes of anger and lashing out.     Past Medical History:  Diagnosis Date   Depression    Diabetes mellitus without complication (HCC)    Gonorrhea 11/2022   Hydradenitis    Hypertension      Family History  Problem Relation Age of Onset   Hypertension Mother    Diabetes Mother        boarderline diabetic   Hypertension Father    Diabetes Paternal Grandmother    Diabetes Paternal Grandfather      Current Outpatient Medications:    Magnesium  Glycinate 120 MG CAPS, Take 2 capsules by mouth every evening., Disp: 90 capsule, Rfl: 1   norethindrone -ethinyl estradiol  (LOESTRIN ) 1-20 MG-MCG tablet, Take 1 tablet by mouth daily., Disp: , Rfl:    spironolactone (ALDACTONE) 50 MG tablet, Take 1 tablet by mouth daily., Disp: , Rfl:    blood glucose meter kit and supplies, Dispense based on patient and insurance preference. Use to monitor FSBS 3x daily. Dx: E11.65. (Patient not taking: Reported on 12/20/2023), Disp: 1 each, Rfl: 0   Blood Glucose Monitoring Suppl (ACCU-CHEK GUIDE) w/Device KIT, 1 Piece by Does not apply route 4 (four) times daily. (Patient not taking: Reported on  12/20/2023), Disp: 1 kit, Rfl: 0   Continuous Glucose Sensor (DEXCOM G7 SENSOR) MISC, by Does not apply route. (Patient not taking: Reported on 12/20/2023), Disp: , Rfl:    fluconazole  (DIFLUCAN ) 100 MG tablet, Take 1 tablet (100 mg total) by mouth daily. Take 1 tablet by mouth now repeat in 5 days, Disp: 2 tablet, Rfl: 0   Glucagon  (GVOKE HYPOPEN  2-PACK) 0.5 MG/0.1ML SOAJ, Inject 0.5 mg into the skin as needed., Disp: 0.2 mL, Rfl: 3   glucose blood (ACCU-CHEK GUIDE) test strip, Use as instructed qid. E11.65 (Patient not taking: Reported on 12/20/2023), Disp: 150 each, Rfl: 2   insulin  lispro (HUMALOG ) 100 UNIT/ML  KwikPen, Inject 12-15 Units into the skin as directed. Based on sliding scale (Patient not taking: Reported on 12/20/2023), Disp: , Rfl:    Insulin  Pen Needle (BD PEN NEEDLE NANO 2ND GEN) 32G X 4 MM MISC, Use as directed to inject Lantus  SQ QD. (Patient not taking: Reported on 12/20/2023), Disp: 100 each, Rfl: 1   Lancets MISC, Dispense based on patient and insurance preference. Use to monitor FSBS 3x daily. Dx: E11.65. (Patient not taking: Reported on 12/20/2023), Disp: 100 each, Rfl: 11   TRESIBA  FLEXTOUCH 100 UNIT/ML FlexTouch Pen, Inject 35 Units into the skin daily. (Patient not taking: Reported on 12/20/2023), Disp: , Rfl:    Allergies  Allergen Reactions   Semaglutide  Nausea And Vomiting   Trulicity [Dulaglutide] Nausea And Vomiting     Review of Systems  Constitutional: Negative.   Respiratory: Negative.    Cardiovascular: Negative.   Neurological: Negative.   Psychiatric/Behavioral: Negative.       Today's Vitals   12/20/23 1421  BP: 110/80  Pulse: 99  Temp: 98.2 F (36.8 C)  TempSrc: Oral  Weight: 202 lb 12.8 oz (92 kg)  Height: 5' 6 (1.676 m)  PainSc: 0-No pain   Body mass index is 32.73 kg/m.  Wt Readings from Last 3 Encounters:  12/20/23 202 lb 12.8 oz (92 kg)  09/20/23 215 lb (97.5 kg)  08/17/23 215 lb (97.5 kg)       12/20/2023    2:27 PM 06/23/2022    2:54 PM 06/12/2021    1:15 PM  GAD 7 : Generalized Anxiety Score  Nervous, Anxious, on Edge 3 0 0  Control/stop worrying 3 0 0  Worry too much - different things 3 0 0  Trouble relaxing 3 0 2  Restless 3 0 0  Easily annoyed or irritable 3 1 0  Afraid - awful might happen 3 0 0  Total GAD 7 Score 21 1 2   Anxiety Difficulty Extremely difficult         12/20/2023    2:25 PM 06/23/2022    2:53 PM 06/12/2021    1:15 PM 01/26/2019   12:00 PM 07/19/2018    3:42 PM  Depression screen PHQ 2/9  Decreased Interest 2 0 0 2 0  Down, Depressed, Hopeless 3 0 0 2 0  PHQ - 2 Score 5 0 0 4 0  Altered sleeping 3 0 3 3    Tired, decreased energy 1 1 1 3    Change in appetite 3 0 1 3   Feeling bad or failure about yourself  3 0 0 1   Trouble concentrating 3 0 2 0   Moving slowly or fidgety/restless 1 0 0 0   Suicidal thoughts 2 0 0 0   PHQ-9 Score 21 1 7 14    Difficult doing work/chores Extremely dIfficult  Very difficult     Objective:  Physical Exam Vitals reviewed.  Constitutional:      General: She is not in acute distress.    Appearance: Normal appearance.  Cardiovascular:     Rate and Rhythm: Normal rate and regular rhythm.     Pulses: Normal pulses.     Heart sounds: Normal heart sounds. No murmur heard. Pulmonary:     Effort: Pulmonary effort is normal. No respiratory distress.     Breath sounds: Normal breath sounds. No wheezing.  Musculoskeletal:        General: No swelling or tenderness. Normal range of motion.  Skin:    General: Skin is warm and dry.     Capillary Refill: Capillary refill takes less than 2 seconds.  Neurological:     General: No focal deficit present.     Mental Status: She is alert.     Cranial Nerves: No cranial nerve deficit.     Motor: No weakness.        Assessment And Plan:  Establishing care with new doctor, encounter for Assessment & Plan: Patient is here to establish care. Went over patient medical, family, social and surgical history. Reviewed with patient their medications and any allergies  Reviewed with patient their sexual orientation, drug/tobacco and alcohol  use Dicussed any new concerns with patient  recommended patient comes in for a physical exam and complete blood work.  Educated patient about the importance of annual screenings and immunizations.  Advised patient to eat a healthy diet along with exercise for atleast 30-45 min atleast 4-5 days of the week.     Hypertension, unspecified type Assessment & Plan: Blood pressure is well controlled, continue current medications.    Uncontrolled type 2 diabetes mellitus with  hyperglycemia (HCC) Assessment & Plan: She has not been on her diabetes medications but would like to start back, will send medications once get lab results back. Will also set her up with a diabetes educator to help understand diabetes better. I have recommended to use her continuous glucose monitor again as well.   Orders: -     BMP8+eGFR -     Hemoglobin A1c -     Microalbumin / creatinine urine ratio -     C-reactive protein -     Glutamic acid decarboxylase auto abs  Situational mixed anxiety and depressive disorder Assessment & Plan: Depression score is 21 and anxiety score is 21. I will refer her to psychiatry.   Orders: -     Ambulatory referral to Psychiatry  Hand tingling Assessment & Plan: Will check for metabolic causes. No abnormal findings on physical exam  Orders: -     TSH  COVID-19 vaccination declined Assessment & Plan: Declines covid 19 vaccine. Discussed risk of covid 62 and if she changes her mind about the vaccine to call the office. Education has been provided regarding the importance of this vaccine but patient still declined. Advised may receive this vaccine at local pharmacy or Health Dept.or vaccine clinic. Aware to provide a copy of the vaccination record if obtained from local pharmacy or Health Dept.  Encouraged to take multivitamin, vitamin d , vitamin c and zinc to increase immune system. Aware can call office if would like to have vaccine here at office. Verbalized acceptance and understanding.    Class 1 obesity due to excess calories without serious comorbidity with body mass index (BMI) of 32.0 to 32.9 in adult Assessment & Plan: She is encouraged to strive for BMI less  than 30 to decrease cardiac risk. Advised to aim for at least 150 minutes of exercise per week.    Screening for STDs (sexually transmitted diseases) -     NuSwab Vaginitis Plus (VG+) -     RPR  Encounter for HIV (human immunodeficiency virus) test -     HIV Antibody  (routine testing w rflx)  Encounter for gynecological examination -     Ambulatory referral to Obstetrics / Gynecology  Other orders -     Magnesium  Glycinate; Take 2 capsules by mouth every evening.  Dispense: 90 capsule; Refill: 1 -     RPR, quant & T.pallidum antibodies    Return in about 4 months (around 04/18/2024) for physical in 8 weeks with f/u dm.  Patient was given opportunity to ask questions. Patient verbalized understanding of the plan and was able to repeat key elements of the plan. All questions were answered to their satisfaction.   Maria Gaines Ada, FNP, have reviewed all documentation for this visit. The documentation on 12/20/23 for the exam, diagnosis, procedures, and orders are all accurate and complete.    IF YOU HAVE BEEN REFERRED TO A SPECIALIST, IT MAY TAKE 1-2 WEEKS TO SCHEDULE/PROCESS THE REFERRAL. IF YOU HAVE NOT HEARD FROM US /SPECIALIST IN TWO WEEKS, PLEASE GIVE US  A CALL AT [phone].

## 2023-12-20 NOTE — Patient Instructions (Signed)
 You can take over the counter magnesium glycinate to help with your anxiety and sleep, take it with your evening meal.

## 2023-12-21 LAB — TSH: TSH: 0.89 u[IU]/mL (ref 0.450–4.500)

## 2023-12-21 LAB — RPR, QUANT+TP ABS (REFLEX)
Rapid Plasma Reagin, Quant: 1:16 {titer} — ABNORMAL HIGH
T Pallidum Abs: REACTIVE — AB

## 2023-12-21 LAB — BMP8+EGFR
BUN/Creatinine Ratio: 10 (ref 9–23)
BUN: 7 mg/dL (ref 6–20)
CO2: 22 mmol/L (ref 20–29)
Calcium: 10 mg/dL (ref 8.7–10.2)
Chloride: 96 mmol/L (ref 96–106)
Creatinine, Ser: 0.68 mg/dL (ref 0.57–1.00)
Glucose: 331 mg/dL — ABNORMAL HIGH (ref 70–99)
Potassium: 4.4 mmol/L (ref 3.5–5.2)
Sodium: 135 mmol/L (ref 134–144)
eGFR: 123 mL/min/{1.73_m2} (ref >59)

## 2023-12-21 LAB — C-REACTIVE PROTEIN: CRP: 3 mg/L (ref 0–10)

## 2023-12-21 LAB — HEMOGLOBIN A1C
Est. average glucose Bld gHb Est-mCnc: 346 mg/dL
Hgb A1c MFr Bld: 13.7 % — ABNORMAL HIGH (ref 4.8–5.6)

## 2023-12-21 LAB — RPR: RPR Ser Ql: REACTIVE — AB

## 2023-12-21 LAB — GLUTAMIC ACID DECARBOXYLASE AUTO ABS: Glutamic Acid Decarb Ab: <5 U/mL (ref 0.0–5.0)

## 2023-12-21 LAB — HIV ANTIBODY (ROUTINE TESTING W REFLEX): HIV Screen 4th Generation wRfx: NONREACTIVE

## 2023-12-22 ENCOUNTER — Encounter: Payer: Self-pay | Admitting: Nurse Practitioner

## 2023-12-22 ENCOUNTER — Other Ambulatory Visit: Payer: Self-pay | Admitting: Nurse Practitioner

## 2023-12-22 DIAGNOSIS — E1165 Type 2 diabetes mellitus with hyperglycemia: Secondary | ICD-10-CM

## 2023-12-22 LAB — MICROALBUMIN / CREATININE URINE RATIO
Creatinine, Urine: 107.5 mg/dL
Microalb/Creat Ratio: 61 mg/g{creat} — ABNORMAL HIGH (ref 0–29)
Microalbumin, Urine: 66 ug/mL

## 2023-12-22 MED ORDER — GVOKE HYPOPEN 2-PACK 0.5 MG/0.1ML ~~LOC~~ SOAJ: 0.5000 mg | SUBCUTANEOUS | 3 refills | Status: AC | PRN

## 2023-12-23 LAB — NUSWAB VAGINITIS PLUS (VG+)
Candida albicans, NAA: NEGATIVE
Candida glabrata, NAA: POSITIVE — AB
Chlamydia trachomatis, NAA: NEGATIVE
Neisseria gonorrhoeae, NAA: NEGATIVE
Trich vag by NAA: NEGATIVE

## 2023-12-25 ENCOUNTER — Encounter: Payer: Self-pay | Admitting: Nurse Practitioner

## 2023-12-25 ENCOUNTER — Other Ambulatory Visit: Payer: Self-pay | Admitting: Nurse Practitioner

## 2023-12-25 MED ORDER — FLUCONAZOLE 100 MG PO TABS: 100.0000 mg | ORAL_TABLET | Freq: Every day | ORAL | 0 refills | Status: DC

## 2023-12-28 ENCOUNTER — Other Ambulatory Visit: Payer: Self-pay | Admitting: Nurse Practitioner

## 2023-12-28 NOTE — Telephone Encounter (Signed)
Called patient to discuss her previous question about getting treatment at the urgent care for Syphyllis due to the Health Department not having availability. After review of the note at [facility] they did see her result and treated accordingly with an antibiotic injection. I did explain to her that I really wanted her to be seen at the health department for further instructions and due to them having a speciality area for STIs. 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 She is to contact the health department again to see next steps on getting retested or if unable we will retest here at the office. She is awaiting the results of her partner. She also informed me today that she started Humira for hidradenitis supprativa after her office visit by her Dermatologist. She now reports having a rash to her shoulder, she is to apply hydrocortisone cream if not better will do an office visit.

## 2023-12-29 ENCOUNTER — Other Ambulatory Visit: Payer: Self-pay | Admitting: Nurse Practitioner

## 2023-12-29 DIAGNOSIS — E6609 Other obesity due to excess calories: Secondary | ICD-10-CM | POA: Insufficient documentation

## 2023-12-29 DIAGNOSIS — R202 Paresthesia of skin: Secondary | ICD-10-CM | POA: Insufficient documentation

## 2023-12-29 DIAGNOSIS — Z2821 Immunization not carried out because of patient refusal: Secondary | ICD-10-CM | POA: Insufficient documentation

## 2023-12-29 DIAGNOSIS — Z7689 Persons encountering health services in other specified circumstances: Secondary | ICD-10-CM | POA: Insufficient documentation

## 2023-12-29 MED ORDER — INSULIN LISPRO (1 UNIT DIAL) 100 UNIT/ML (KWIKPEN): PEN_INJECTOR | SUBCUTANEOUS | 1 refills | Status: DC

## 2023-12-29 MED ORDER — TRESIBA FLEXTOUCH 100 UNIT/ML ~~LOC~~ SOPN: 10.0000 [IU] | PEN_INJECTOR | Freq: Every day | SUBCUTANEOUS | 1 refills | Status: DC

## 2023-12-29 NOTE — Assessment & Plan Note (Signed)
 She is encouraged to strive for BMI less than 30 to decrease cardiac risk. Advised to aim for at least 150 minutes of exercise per week.

## 2023-12-29 NOTE — Assessment & Plan Note (Signed)

## 2023-12-29 NOTE — Assessment & Plan Note (Addendum)
Depression score is 21 and anxiety score is 21. I will refer her to psychiatry.

## 2023-12-29 NOTE — Assessment & Plan Note (Signed)

## 2023-12-29 NOTE — Assessment & Plan Note (Signed)
Will check for metabolic causes. No abnormal findings on physical exam

## 2023-12-29 NOTE — Assessment & Plan Note (Signed)
Blood pressure is well controlled, continue current medications.

## 2023-12-29 NOTE — Assessment & Plan Note (Addendum)
She has not been on her diabetes medications but would like to start back, will send medications once get lab results back. Will also set her up with a diabetes educator to help understand diabetes better. I have recommended to use her continuous glucose monitor again as well.

## 2024-01-04 ENCOUNTER — Other Ambulatory Visit: Payer: Self-pay

## 2024-01-04 ENCOUNTER — Encounter: Payer: Self-pay | Admitting: Nurse Practitioner

## 2024-01-04 DIAGNOSIS — E1165 Type 2 diabetes mellitus with hyperglycemia: Secondary | ICD-10-CM

## 2024-01-04 MED ORDER — INSULIN LISPRO (1 UNIT DIAL) 100 UNIT/ML (KWIKPEN): PEN_INJECTOR | SUBCUTANEOUS | 1 refills | Status: DC

## 2024-01-11 ENCOUNTER — Encounter: Payer: Self-pay | Admitting: Nurse Practitioner

## 2024-01-26 ENCOUNTER — Telehealth: Payer: Self-pay

## 2024-01-26 NOTE — Telephone Encounter (Signed)
RX benefits - pa for humalog not needed.

## 2024-02-06 ENCOUNTER — Telehealth: Payer: Self-pay

## 2024-02-06 NOTE — Telephone Encounter (Signed)
 PA for tresiba sent to plan.

## 2024-02-14 ENCOUNTER — Encounter: Payer: Self-pay | Admitting: Nurse Practitioner

## 2024-02-14 ENCOUNTER — Ambulatory Visit: Payer: Medicaid Other | Admitting: Nurse Practitioner

## 2024-02-14 VITALS — BP 110/60 | HR 97 | Temp 98.7°F | Wt 224.6 lb

## 2024-02-14 DIAGNOSIS — Z113 Encounter for screening for infections with a predominantly sexual mode of transmission: Secondary | ICD-10-CM

## 2024-02-14 DIAGNOSIS — Z6836 Body mass index (BMI) 36.0-36.9, adult: Secondary | ICD-10-CM

## 2024-02-14 DIAGNOSIS — N898 Other specified noninflammatory disorders of vagina: Secondary | ICD-10-CM | POA: Diagnosis not present

## 2024-02-14 DIAGNOSIS — E1165 Type 2 diabetes mellitus with hyperglycemia: Secondary | ICD-10-CM | POA: Diagnosis not present

## 2024-02-14 DIAGNOSIS — K219 Gastro-esophageal reflux disease without esophagitis: Secondary | ICD-10-CM

## 2024-02-14 DIAGNOSIS — E6609 Other obesity due to excess calories: Secondary | ICD-10-CM

## 2024-02-14 DIAGNOSIS — E66812 Obesity, class 2: Secondary | ICD-10-CM

## 2024-02-14 DIAGNOSIS — H538 Other visual disturbances: Secondary | ICD-10-CM

## 2024-02-14 DIAGNOSIS — Z2821 Immunization not carried out because of patient refusal: Secondary | ICD-10-CM

## 2024-02-14 DIAGNOSIS — A530 Latent syphilis, unspecified as early or late: Secondary | ICD-10-CM

## 2024-02-14 LAB — HEMOGLOBIN A1C
Est. average glucose Bld gHb Est-mCnc: 278 mg/dL
Hgb A1c MFr Bld: 11.3 % — ABNORMAL HIGH (ref 4.8–5.6)

## 2024-02-14 MED ORDER — OMEPRAZOLE 20 MG PO CPDR: 20.0000 mg | DELAYED_RELEASE_CAPSULE | Freq: Every day | ORAL | 2 refills | Status: AC

## 2024-02-14 MED ORDER — TRESIBA FLEXTOUCH 100 UNIT/ML ~~LOC~~ SOPN: 20.0000 [IU] | PEN_INJECTOR | Freq: Every day | SUBCUTANEOUS | Status: DC

## 2024-02-14 MED ORDER — SEMAGLUTIDE(0.25 OR 0.5MG/DOS) 2 MG/3ML ~~LOC~~ SOPN
0.2500 mg | PEN_INJECTOR | SUBCUTANEOUS | 1 refills | Status: DC
Start: 2024-02-14 — End: 2024-10-30

## 2024-02-14 MED ORDER — FLUCONAZOLE 100 MG PO TABS: 100.0000 mg | ORAL_TABLET | Freq: Every day | ORAL | 0 refills | Status: DC

## 2024-02-14 NOTE — Progress Notes (Signed)
 Madelaine Bhat, CMA,acting as a Neurosurgeon for Arnette Felts, FNP.,have documented all relevant documentation on the behalf of Arnette Felts, FNP,as directed by  Arnette Felts, FNP while in the presence of Arnette Felts, FNP.  Subjective:  Patient ID: Maria Aguilar , female    DOB: 03-07-1996 , 28 y.o.   MRN: [id number]  No chief complaint on file.   HPI  Patient presents today for a dm follow up, Patient reports compliance with medication. Patient denies any chest pain, SOB, or headaches. Patient reports sometimes having thick gray discharge. Patient would also like STD testing but denies any symptoms. She is trying to drink more water daily doing about 3 glasses a day. She has some vaginal itching around her vaginal area. She has had to take diflucan reoccurring in the past.   She has taken the medication for the syphyllis.   Diabetes She presents for her follow-up diabetic visit. She has type 2 diabetes mellitus. There are no hypoglycemic associated symptoms. Associated symptoms include blurred vision and fatigue. Pertinent negatives for diabetes include no polydipsia, no polyphagia, no polyuria and no weakness.     Past Medical History:  Diagnosis Date   Depression    Diabetes mellitus without complication (HCC)    Gonorrhea 11/2022   Hydradenitis    Hypertension      Family History  Problem Relation Age of Onset   Hypertension Mother    Diabetes Mother        boarderline diabetic   Hypertension Father    Diabetes Paternal Grandmother    Diabetes Paternal Grandfather      Current Outpatient Medications:    fluconazole (DIFLUCAN) 100 MG tablet, Take 1 tablet (100 mg total) by mouth daily. Take 1 tablet by mouth now repeat in 5 days, Disp: 2 tablet, Rfl: 0   Glucagon (GVOKE HYPOPEN 2-PACK) 0.5 MG/0.1ML SOAJ, Inject 0.5 mg into the skin as needed., Disp: 0.2 mL, Rfl: 3   insulin lispro (HUMALOG) 100 UNIT/ML KwikPen, Inject Embden insulin before meals and at hs if BS < 150 - 0 units,  150-199 - 3 units, 200-249 - 6 units, 250-299 - 9 units, 300-349 - 12 units, >350 - 15 units. If >400 call our office with your blood sugar readings., Disp: 15 mL, Rfl: 1   Magnesium Glycinate 120 MG CAPS, Take 2 capsules by mouth every evening., Disp: 90 capsule, Rfl: 1   norethindrone-ethinyl estradiol (LOESTRIN) 1-20 MG-MCG tablet, Take 1 tablet by mouth daily., Disp: , Rfl:    omeprazole (PRILOSEC) 20 MG capsule, Take 1 capsule (20 mg total) by mouth daily. X 2 weeks then daily as needed, Disp: 30 capsule, Rfl: 2   Semaglutide,0.25 or 0.5MG /DOS, 2 MG/3ML SOPN, Inject 0.25 mg into the skin once a week., Disp: 9 mL, Rfl: 1   blood glucose meter kit and supplies, Dispense based on patient and insurance preference. Use to monitor FSBS 3x daily. Dx: E11.65. (Patient not taking: Reported on 12/20/2023), Disp: 1 each, Rfl: 0   Blood Glucose Monitoring Suppl (ACCU-CHEK GUIDE) w/Device KIT, 1 Piece by Does not apply route 4 (four) times daily. (Patient not taking: Reported on 02/14/2024), Disp: 1 kit, Rfl: 0   Continuous Glucose Sensor (DEXCOM G7 SENSOR) MISC, by Does not apply route. (Patient not taking: Reported on 02/14/2024), Disp: , Rfl:    glucose blood (ACCU-CHEK GUIDE) test strip, Use as instructed qid. E11.65 (Patient not taking: Reported on 02/14/2024), Disp: 150 each, Rfl: 2   Insulin Pen Needle (BD PEN  NEEDLE NANO 2ND GEN) 32G X 4 MM MISC, Use as directed to inject Lantus SQ QD., Disp: 100 each, Rfl: 1   Lancets MISC, Dispense based on patient and insurance preference. Use to monitor FSBS 3x daily. Dx: E11.65. (Patient not taking: Reported on 12/20/2023), Disp: 100 each, Rfl: 11   spironolactone (ALDACTONE) 50 MG tablet, Take 1 tablet by mouth daily. (Patient not taking: Reported on 02/14/2024), Disp: , Rfl:    TRESIBA FLEXTOUCH 100 UNIT/ML FlexTouch Pen, Inject 20 Units into the skin daily. Increase by 2 units every 3 days if blood sugar over 130, Disp: , Rfl:    Allergies  Allergen Reactions    Semaglutide Nausea And Vomiting   Trulicity [Dulaglutide] Nausea And Vomiting     Review of Systems  Constitutional:  Positive for fatigue.  Eyes:  Positive for blurred vision.  Respiratory: Negative.    Cardiovascular: Negative.   Endocrine: Negative for polydipsia, polyphagia and polyuria.  Musculoskeletal:        Leg stiffness/mostly her shins   Skin: Negative.   Neurological: Negative.  Negative for weakness.  Psychiatric/Behavioral: Negative.       Today's Vitals   02/14/24 1434  BP: 110/60  Pulse: 97  Temp: 98.7 F (37.1 C)  TempSrc: Oral  Weight: 224 lb 9.6 oz (101.9 kg)  PainSc: 0-No pain   Body mass index is 36.25 kg/m.  Wt Readings from Last 3 Encounters:  02/14/24 224 lb 9.6 oz (101.9 kg)  12/20/23 202 lb 12.8 oz (92 kg)  09/20/23 215 lb (97.5 kg)     Objective:  Physical Exam Vitals reviewed.  Constitutional:      General: She is not in acute distress.    Appearance: Normal appearance.  Cardiovascular:     Rate and Rhythm: Normal rate and regular rhythm.     Pulses: Normal pulses.     Heart sounds: Normal heart sounds. No murmur heard. Pulmonary:     Effort: Pulmonary effort is normal. No respiratory distress.     Breath sounds: Normal breath sounds. No wheezing.  Musculoskeletal:        General: No swelling or tenderness. Normal range of motion.  Skin:    General: Skin is warm and dry.     Capillary Refill: Capillary refill takes less than 2 seconds.  Neurological:     General: No focal deficit present.     Mental Status: She is alert.     Cranial Nerves: No cranial nerve deficit.     Motor: No weakness.         Assessment And Plan:  Uncontrolled type 2 diabetes mellitus with hyperglycemia (HCC) Assessment & Plan: She is feeling much better about her diabetes and her regimen. Will recheck her A1c today. She has also met with Genelle the diabetic educator.   Orders: -     Hemoglobin A1c -     Semaglutide(0.25 or 0.5MG /DOS); Inject 0.25  mg into the skin once a week.  Dispense: 9 mL; Refill: 1 -     Tresiba FlexTouch; Inject 20 Units into the skin daily. Increase by 2 units every 3 days if blood sugar over 130  COVID-19 vaccination declined Assessment & Plan: Declines covid 19 vaccine. Discussed risk of covid 80 and if she changes her mind about the vaccine to call the office. Education has been provided regarding the importance of this vaccine but patient still declined. Advised may receive this vaccine at local pharmacy or Health Dept.or vaccine clinic. Aware to provide  a copy of the vaccination record if obtained from local pharmacy or Health Dept.  Encouraged to take multivitamin, vitamin d, vitamin c and zinc to increase immune system. Aware can call office if would like to have vaccine here at office. Verbalized acceptance and understanding.    Class 2 obesity due to excess calories with body mass index (BMI) of 36.0 to 36.9 in adult, unspecified whether serious comorbidity present Assessment & Plan: She is encouraged to strive for BMI less than 30 to decrease cardiac risk. Advised to aim for at least 150 minutes of exercise per week.    Vaginal discharge Assessment & Plan: Will check vaginal swab. She has been treated for syphyllis and will recheck in 6 months.   Orders: -     NuSwab Vaginitis Plus (VG+) -     Fluconazole; Take 1 tablet (100 mg total) by mouth daily. Take 1 tablet by mouth now repeat in 5 days  Dispense: 2 tablet; Refill: 0  Screening for STDs (sexually transmitted diseases) -     RPR; Future  Blurred vision Assessment & Plan: May be related to her diabetes, will refer to opthalmology  Orders: -     Ambulatory referral to Ophthalmology  Gastroesophageal reflux disease without esophagitis Assessment & Plan: Will treat with omeprazole, encouraged to avoid fried and fatty foods, spicy foods.   Orders: -     Omeprazole; Take 1 capsule (20 mg total) by mouth daily. X 2 weeks then daily as  needed  Dispense: 30 capsule; Refill: 2  Positive serology for syphilis Assessment & Plan: Will recheck in 6 months treated with antibiotics  Orders: -     RPR; Future -     T pallidum Screening [place]; Future    Return for KEEP SAME NEXT.  Patient was given opportunity to ask questions. Patient verbalized understanding of the plan and was able to repeat key elements of the plan. All questions were answered to their satisfaction.   Jeanell Sparrow, FNP, have reviewed all documentation for this visit. The documentation on 02/14/24 for the exam, diagnosis, procedures, and orders are all accurate and complete.   IF YOU HAVE BEEN REFERRED TO A SPECIALIST, IT MAY TAKE 1-2 WEEKS TO SCHEDULE/PROCESS THE REFERRAL. IF YOU HAVE NOT HEARD FROM US/SPECIALIST IN TWO WEEKS, PLEASE GIVE [place] A CALL AT [phone].

## 2024-02-15 ENCOUNTER — Encounter: Payer: Self-pay | Admitting: Nurse Practitioner

## 2024-02-17 ENCOUNTER — Encounter: Payer: Self-pay | Admitting: Nurse Practitioner

## 2024-02-17 NOTE — Telephone Encounter (Signed)
 Did she have a PA? Have her to call her insurance company to see what they do cover.

## 2024-02-18 LAB — NUSWAB VAGINITIS PLUS (VG+)
Candida albicans, NAA: NEGATIVE
Candida glabrata, NAA: NEGATIVE
Chlamydia trachomatis, NAA: NEGATIVE
Neisseria gonorrhoeae, NAA: NEGATIVE
Trich vag by NAA: NEGATIVE

## 2024-02-21 ENCOUNTER — Other Ambulatory Visit: Payer: Self-pay

## 2024-02-21 MED ORDER — BD PEN NEEDLE NANO 2ND GEN 32G X 4 MM MISC: 1 refills | Status: DC

## 2024-02-26 DIAGNOSIS — Z6836 Body mass index (BMI) 36.0-36.9, adult: Secondary | ICD-10-CM | POA: Insufficient documentation

## 2024-02-26 DIAGNOSIS — K219 Gastro-esophageal reflux disease without esophagitis: Secondary | ICD-10-CM | POA: Insufficient documentation

## 2024-02-26 DIAGNOSIS — H538 Other visual disturbances: Secondary | ICD-10-CM | POA: Insufficient documentation

## 2024-02-26 DIAGNOSIS — E6609 Other obesity due to excess calories: Secondary | ICD-10-CM | POA: Insufficient documentation

## 2024-02-26 DIAGNOSIS — A530 Latent syphilis, unspecified as early or late: Secondary | ICD-10-CM | POA: Insufficient documentation

## 2024-02-26 NOTE — Assessment & Plan Note (Signed)
 Will treat with omeprazole, encouraged to avoid fried and fatty foods, spicy foods.

## 2024-02-26 NOTE — Assessment & Plan Note (Signed)
 May be related to her diabetes, will refer to opthalmology

## 2024-02-26 NOTE — Assessment & Plan Note (Signed)
 Will recheck in 6 months treated with antibiotics

## 2024-02-26 NOTE — Assessment & Plan Note (Signed)
 She is encouraged to strive for BMI less than 30 to decrease cardiac risk. Advised to aim for at least 150 minutes of exercise per week.

## 2024-02-26 NOTE — Assessment & Plan Note (Signed)

## 2024-02-26 NOTE — Assessment & Plan Note (Signed)
 Will check vaginal swab. She has been treated for syphyllis and will recheck in 6 months.

## 2024-02-26 NOTE — Assessment & Plan Note (Signed)
 She is feeling much better about her diabetes and her regimen. Will recheck her A1c today. She has also met with Genelle the diabetic educator.

## 2024-02-29 ENCOUNTER — Other Ambulatory Visit (HOSPITAL_COMMUNITY)
Admission: RE | Admit: 2024-02-29 | Discharge: 2024-02-29 | Disposition: A | Discharge status: Home / Self Care | Source: Ambulatory Visit | Attending: Adult Health | Admitting: Adult Health

## 2024-02-29 ENCOUNTER — Encounter: Payer: Self-pay | Admitting: Adult Health

## 2024-02-29 ENCOUNTER — Ambulatory Visit: Payer: Medicaid Other | Admitting: Adult Health

## 2024-02-29 VITALS — BP 136/81 | HR 86 | Ht 66.0 in | Wt 223.6 lb

## 2024-02-29 DIAGNOSIS — F419 Anxiety disorder, unspecified: Secondary | ICD-10-CM | POA: Diagnosis not present

## 2024-02-29 DIAGNOSIS — Z01419 Encounter for gynecological examination (general) (routine) without abnormal findings: Secondary | ICD-10-CM | POA: Diagnosis present

## 2024-02-29 DIAGNOSIS — Z1331 Encounter for screening for depression: Secondary | ICD-10-CM

## 2024-02-29 DIAGNOSIS — Z3041 Encounter for surveillance of contraceptive pills: Secondary | ICD-10-CM | POA: Diagnosis not present

## 2024-02-29 DIAGNOSIS — F32A Depression, unspecified: Secondary | ICD-10-CM

## 2024-02-29 MED ORDER — NORETHINDRONE ACET-ETHINYL EST 1-20 MG-MCG PO TABS: 1.0000 | ORAL_TABLET | Freq: Every day | ORAL | 4 refills | Status: DC

## 2024-02-29 NOTE — Progress Notes (Signed)
 Patient ID: Maria Aguilar, female   DOB: [birth year], 28 y.o.   MRN: [id number] History of Present Illness: Maria Aguilar is a 28 year old black female, single, G0P0, in for a well woman gyn exam and pap. 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   PCP is Arnette Felts FNP   Current Medications, Allergies, Past Medical History, Past Surgical History, Family History and Social History were reviewed in [employer] record.     Review of Systems: Patient denies any  hearing loss, fatigue, blurred vision, shortness of breath, chest pain, abdominal pain, problems with bowel movements, urination, or intercourse. No joint pain or mood swings.  Having headaches esp at work, looks at a computer all day, to get eye exam She has DM A1c was 11.3 on  02/14/24  Physical Exam:BP 136/81 (BP Location: Right Arm, Patient Position: Sitting, Cuff Size: Large)   Pulse 86   Ht 5\' 6"  (1.676 m)   Wt 223 lb 9.6 oz (101.4 kg)   LMP  (LMP Unknown)   BMI 36.09 kg/m   General:  Well developed, well nourished, no acute distress Skin:  Warm and dry Neck:  Midline trachea, normal thyroid, good ROM, no lymphadenopathy Lungs; Clear to auscultation bilaterally Breast:  No dominant palpable mass, retraction, or nipple discharge Cardiovascular: Regular rate and rhythm Abdomen:  Soft, non tender, no hepatosplenomegaly Pelvic:  External genitalia is normal in appearance, no lesions.  The vagina is normal in appearance. Urethra has no lesions or masses. The cervix is smooth, pap with GC/CHL and HR HPV genotyping performed.  Uterus is felt to be normal size, shape, and contour.  No adnexal masses or tenderness noted.Bladder is non tender, no masses felt. Extremities/musculoskeletal:  No swelling or varicosities noted, no clubbing or cyanosis Psych:  No mood changes, alert and cooperative,seems happy AA is 0 Fall risk is low    02/29/2024    3:05 PM 12/20/2023    2:25 PM 06/23/2022     2:53 PM  Depression screen PHQ 2/9  Decreased Interest 1 2 0  Down, Depressed, Hopeless 3 3 0  PHQ - 2 Score 4 5 0  Altered sleeping 3 3 0  Tired, decreased energy 3 1 1   Change in appetite 3 3 0  Feeling bad or failure about yourself  3 3 0  Trouble concentrating 3 3 0  Moving slowly or fidgety/restless 3 1 0  Suicidal thoughts 2 2 0  PHQ-9 Score 24 21 1   Difficult doing work/chores  Extremely dIfficult        02/29/2024    3:06 PM 12/20/2023    2:27 PM 06/23/2022    2:54 PM 06/12/2021    1:15 PM  GAD 7 : Generalized Anxiety Score  Nervous, Anxious, on Edge 3 3 0 0  Control/stop worrying 3 3 0 0  Worry too much - different things 3 3 0 0  Trouble relaxing 3 3 0 2  Restless 3 3 0 0  Easily annoyed or irritable 3 3 1  0  Afraid - awful might happen 3 3 0 0  Total GAD 7 Score 21 21 1 2   Anxiety Difficulty  Extremely difficult      Upstream - 02/29/24 1507       Pregnancy Intention Screening   Does the patient want to become pregnant in the next year? Unsure    Does the patient's partner want to become pregnant in the next year? Unsure  Would the patient like to discuss contraceptive options today? No      Contraception Wrap Up   Current Method Oral Contraceptive    End Method Oral Contraceptive    Contraception Counseling Provided No            Examination chaperoned by Faith Rogue LPN   Impression and plan: 1. Encounter for gynecological examination with Papanicolaou smear of cervix (Primary) Pap sent Pap in 3 years if normal Physical in 1 year Labs with PCP  - Cytology - PAP( [facility])  2. Encounter for surveillance of contraceptive pills Happy with Loestrin 1-20, will refill Meds ordered this encounter  Medications   norethindrone-ethinyl estradiol (LOESTRIN) 1-20 MG-MCG tablet    Sig: Take 1 tablet by mouth daily.    Dispense:  84 tablet    Refill:  4    Supervising Provider:   Duane Lope H [[id number]]     3. Anxiety and depression She denies  SI or HI and sees therapist weekly and has new appt 04/02/24 with psychiatrist

## 2024-03-02 ENCOUNTER — Encounter: Payer: Self-pay | Admitting: Nurse Practitioner

## 2024-03-06 ENCOUNTER — Other Ambulatory Visit: Payer: Self-pay

## 2024-03-06 LAB — CYTOLOGY - PAP
Chlamydia: NEGATIVE
Comment: NEGATIVE
Comment: NEGATIVE
Comment: NEGATIVE
Comment: NEGATIVE
Comment: NORMAL
HPV 16: NEGATIVE
HPV 18 / 45: NEGATIVE
High risk HPV: POSITIVE — AB
Neisseria Gonorrhea: NEGATIVE

## 2024-03-06 MED ORDER — BD PEN NEEDLE NANO 2ND GEN 32G X 4 MM MISC: 1 refills | Status: DC

## 2024-03-07 ENCOUNTER — Encounter: Payer: Self-pay | Admitting: Adult Health

## 2024-03-07 DIAGNOSIS — R87612 Low grade squamous intraepithelial lesion on cytologic smear of cervix (LGSIL): Secondary | ICD-10-CM | POA: Insufficient documentation

## 2024-03-07 DIAGNOSIS — Z8742 Personal history of other diseases of the female genital tract: Secondary | ICD-10-CM | POA: Insufficient documentation

## 2024-03-19 ENCOUNTER — Ambulatory Visit: Admitting: Women's Health

## 2024-03-19 ENCOUNTER — Encounter: Payer: Self-pay | Admitting: Women's Health

## 2024-03-19 ENCOUNTER — Other Ambulatory Visit (HOSPITAL_COMMUNITY)
Admission: RE | Admit: 2024-03-19 | Discharge: 2024-03-19 | Disposition: A | Discharge status: Home / Self Care | Source: Ambulatory Visit | Attending: Women's Health | Admitting: Women's Health

## 2024-03-19 VITALS — BP 125/89 | HR 102 | Ht 66.0 in | Wt 224.0 lb

## 2024-03-19 DIAGNOSIS — Z3202 Encounter for pregnancy test, result negative: Secondary | ICD-10-CM | POA: Diagnosis not present

## 2024-03-19 DIAGNOSIS — R87612 Low grade squamous intraepithelial lesion on cytologic smear of cervix (LGSIL): Secondary | ICD-10-CM

## 2024-03-19 DIAGNOSIS — Z8742 Personal history of other diseases of the female genital tract: Secondary | ICD-10-CM

## 2024-03-19 LAB — POCT URINE PREGNANCY: Preg Test, Ur: NEGATIVE

## 2024-03-19 NOTE — Progress Notes (Signed)
   COLPOSCOPY PROCEDURE NOTE Patient name: Maria Aguilar MRN [id number]  Date of birth: September 24, 1996 Subjective Findings:   Maria Aguilar is a 28 y.o. G0P0000 African American female being seen today for a colposcopy. Indication: Abnormal pap on 02/29/24: LSIL w/ HRHPV positive: other (not 16, 18/45)  Prior cytology:  Date Result Procedure  2022 NILM w/ HRHPV not done None              No LMP recorded. (Menstrual status: Oral contraceptives). Contraception: OCP (estrogen/progesterone). Menopausal: no. Hysterectomy: no.   Considering pregnancy: not sure New sex partner: yes Smoker: no. Immunocompromised: no.   The risks and benefits were explained and informed consent was obtained, and written copy is in chart. Pertinent History Reviewed:   Reviewed past medical,surgical, social, obstetrical and family history.  Reviewed problem list, medications and allergies. Objective Findings & Procedure:   Vitals:   03/19/24 1033 03/19/24 1038  BP: (!) 132/90 125/89  Pulse: (!) 108 (!) 102  Weight: 224 lb (101.6 kg)   Height: 5\' 6"  (1.676 m)   Body mass index is 36.15 kg/m.  No results found for this or any previous visit (from the past 24 hours).   Time out was performed.  Speculum placed in the vagina, cervix fully visualized. SCJ: not fully visualized. Cervix swabbed x 3 with acetic acid.  Acetowhitening present: No Cervix: no visible lesions, no mosaicism, no punctation, and no abnormal vasculature. Endocervical curettage performed, Cervical biopsies taken at 6 o'clock, and Hemostasis achieved with Monsel's solution. Vagina: vaginal colposcopy not performed Vulva: vulvar colposcopy not performed  Specimens: 2  Complications: none  Chaperone: Laverne Potter  Colposcopic Impression & Plan:   Normal colposcopy without lesions Plan: Post biopsy instructions given, Will notify patient of results when back, and Will base plan of care on pathology results and ASCCP  guidelines  Return in about 1 year (around 03/19/2025) for Pap & physical.  Ferd Householder CNM, WHNP-BC 03/19/2024 11:03 AM

## 2024-03-19 NOTE — Addendum Note (Signed)
 Addended by: Myrl Askew on: 03/19/2024 11:50 AM   Modules accepted: Orders

## 2024-03-19 NOTE — Patient Instructions (Signed)
 Colposcopy, Care After  The following information offers guidance on how to care for yourself after your procedure. Your health care provider may also give you more specific instructions. If you have problems or questions, contact your health care provider. What can I expect after the procedure? If you had a colposcopy without a biopsy, you can expect to feel fine right away after your procedure. However, you may have some spotting of blood for a few days. You can return to your normal activities. If you had a colposcopy with a biopsy, it is common after the procedure to have: Soreness and mild pain. These may last for a few days. Mild vaginal bleeding or discharge that is dark-colored and grainy. This may last for a few days. The discharge may be caused by a liquid (solution) that was used during the procedure. You may need to wear a sanitary pad during this time. Spotting of blood for at least 48 hours after the procedure. Follow these instructions at home: Medicines Take over-the-counter and prescription medicines only as told by your health care provider. Talk with your health care provider about what type of over-the-counter pain medicines and prescription medicines you can start to take again. It is especially important to talk with your health care provider if you take blood thinners. Activity Avoid using douche products, using tampons, and having sex for at least 3 days after the procedure or for as long as told by your health care provider. Return to your normal activities as told by your health care provider. Ask your health care provider what activities are safe for you. General instructions Ask your health care provider if you may take baths, swim, or use a hot tub. You may take showers. If you use birth control (contraception), continue to use it. Keep all follow-up visits. This is important. Contact a health care provider if: You have a fever or chills. You faint or feel  light-headed. Get help right away if: You have heavy bleeding from your vagina or pass blood clots. Heavy bleeding is bleeding that soaks through a sanitary pad in less than 1 hour. You have vaginal discharge that is abnormal, is yellow in color, or smells bad. This could be a sign of infection. You have severe pain or cramps in your lower abdomen that do not go away with medicine. Summary If you had a colposcopy without a biopsy, you can expect to feel fine right away, but you may have some spotting of blood for a few days. You can return to your normal activities. If you had a colposcopy with a biopsy, it is common to have mild pain for a few days and spotting for 48 hours after the procedure. Avoid using douche products, using tampons, and having sex for at least 3 days after the procedure or for as long as told by your health care provider. Get help right away if you have heavy bleeding, severe pain, or signs of infection. This information is not intended to replace advice given to you by your health care provider. Make sure you discuss any questions you have with your health care provider. Document Revised: 04/19/2021 Document Reviewed: 04/19/2021 Elsevier Patient Education  2024 [employer].

## 2024-03-21 LAB — SURGICAL PATHOLOGY

## 2024-03-22 ENCOUNTER — Encounter: Payer: Self-pay | Admitting: Women's Health

## 2024-04-02 ENCOUNTER — Encounter (HOSPITAL_COMMUNITY): Payer: Self-pay | Admitting: Registered Nurse

## 2024-04-02 ENCOUNTER — Ambulatory Visit (INDEPENDENT_AMBULATORY_CARE_PROVIDER_SITE_OTHER): Admitting: Registered Nurse

## 2024-04-02 VITALS — BP 118/84 | HR 89 | Ht 66.0 in | Wt 223.2 lb

## 2024-04-02 DIAGNOSIS — F332 Major depressive disorder, recurrent severe without psychotic features: Secondary | ICD-10-CM

## 2024-04-02 DIAGNOSIS — G4700 Insomnia, unspecified: Secondary | ICD-10-CM

## 2024-04-02 DIAGNOSIS — F411 Generalized anxiety disorder: Secondary | ICD-10-CM

## 2024-04-02 MED ORDER — BUSPIRONE HCL 5 MG PO TABS
5.0000 mg | ORAL_TABLET | Freq: Three times a day (TID) | ORAL | 0 refills | Status: DC
Start: 2024-04-02 — End: 2024-04-16

## 2024-04-02 MED ORDER — TRAZODONE HCL 50 MG PO TABS: 50.0000 mg | ORAL_TABLET | Freq: Every evening | ORAL | 0 refills | Status: DC | PRN

## 2024-04-02 MED ORDER — FLUOXETINE HCL 10 MG PO CAPS: 10.0000 mg | ORAL_CAPSULE | Freq: Every day | ORAL | 0 refills | Status: DC

## 2024-04-02 NOTE — Patient Instructions (Signed)
 Labs have been ordered since it has been a while since you had any.  Labs are checked at least yearly and more frequently depending on prescribed medication.  Routine blood work is an important part of assessing a patient's overall health and to rule out any condition that is not psychiatric related.  Tests such as a complete blood count, metabolic panel, lipid panel, thyroid function tests, and hemoglobin A1c test (screening for diabetes) can help a psychiatrist understand the general medical health of a patient.  Blood work can help make informed decisions about a potential differential diagnosis and help understand other factors that may be the correct reason for the presentation.  In some cases, blood work can aid in the diagnosis, medication management, and/or treatment of your a mental health. Please have labs drawn prior to next scheduled visit.   Routine Labs checked at least yearly.  May need to be checked more often depending on prescribed medications.   CBC with Differential/Platelet    Comprehensive metabolic panel    Hemoglobin A1c    Magnesium    Ethanol    Urinalysis, Routine w reflex microscopic Urine, Clean Catch    Pregnancy, urine (females of child bearing age) POCT Urine Drug Screen:  If prescribed any controlled substance  Also recommend EKG prior to starting psychotropic medications as a baseline and periodically after starting psychotropic medications to monitor for prolong QTc which can indicate the presence of cardiac risk factors.  Studies have shown that some psychotropic medications can increase the risk of prolong QTc.  Please have your primary care provider to do EKG at your next scheduled visit and send results if not available on Epic.      Call 911, [phone], mobile crisis, or present to the nearest emergency room should you experience any suicidal/homicidal ideation, auditory/visual/hallucinations, or detrimental worsening of your mental health.  Mobile Crisis Response  Teams Listed by counties in vicinity of [facility] providers [facility] [phone] [facility] [phone] [facility] [phone] [facility] [phone] [place]                * [facility] [phone]                * [facility] [phone]  [facility] [phone] [facility] [employer] [employer]  [phone] * [facility] [employer] [phone]

## 2024-04-02 NOTE — Progress Notes (Signed)
 Psychiatric Initial Adult Assessment   Patient Identification: Maria Aguilar MRN:  [id number] Date of Evaluation:  04/02/2024 Referral Source: [name], FNP  Chief Complaint:   Chief Complaint  Patient presents with   Establish Care    Medication management   Visit Diagnosis:    ICD-10-CM   1. Severe episode of recurrent major depressive disorder, without psychotic features (HCC)  F33.2 FLUoxetine  (PROZAC ) 10 MG capsule    busPIRone (BUSPAR) 5 MG tablet    2. GAD (generalized anxiety disorder)  F41.1 FLUoxetine  (PROZAC ) 10 MG capsule    busPIRone (BUSPAR) 5 MG tablet    3. Insomnia, unspecified type  G47.00 traZODone (DESYREL) 50 MG tablet      History of Present Illness:  Maria Aguilar 28 y.o. female presents to office today to establish care for medication management.  She is seen face to face by this provider, and chart reviewed on 04/02/24.  Her psychiatric history is significant for major depression, general anxiety, and insomnia.  She is not currently taking psychotropic medications but has taken in the past.  She states she has taken Zoloft, Buspar, Celexa and Trazodone in the past.  She states she didn't really give the medications a chance to work.  She denies a history of suicide attempt, self-injurious behavior, and psychiatric hospitalization.  Reports she has had outpatient psychiatric services but wasn't satisfied with services.  She states she was sent to the emergency room or to urgent care in October 2024 by her PCP but once the evaluation was completed "The man told me nothing was wrong with me and I was sent home."  She states as a result she was dismissed from her PCP office for non-compliance.  She reports her primary stressor is the relationship with her mother.  "My family doesn't believe in mental health.  They are like just handle it.  She states that she and her mother also argue about her room not being clean.  "It is dirty but when I try to clean it I just  get overwhelmed and can't do it."  She also states she feels that she doesn't have support by anyone in her family.  She reports that she has had passive suicidal thoughts on/off at times but denies today.  She reports episodes of crying spells, feeling overwhelmed, irritability, low energy and fatigue.   Today she denies suicidal/self-harm/homicidal ideation, psychosis, paranoia, and abnormal movements  AIMS, PHQ 2/9, C-SSRS, AUDIT, and GAD 7 screenings conducted by provider see scores below.  She reports eating without difficulty.  States she has a hard time to fall and stay asleep.  She states she smokes marijuana nightly to help with sleep.      Recommended the following:  Start Prozac  10 mg daily will increase to 20 mg at next visit.  Start Buspar 5 mg Bid, and Trazodone 50 mg Q hs prn.  Referral to counseling/therapy.   She is Informed of side effect/efficacy profile on Prozac , Buspar, and Trazodone.  She is also informed that usually takes a couple of weeks before notable improvements are seen.  She voices understanding with information being given to her today and is agreeable to recommendations.    Associated Signs/Symptoms: Depression Symptoms:  depressed mood, anhedonia, fatigue, difficulty concentrating, hopelessness, anxiety, disturbed sleep, (Hypo) Manic Symptoms:  Irritable Mood, Labiality of Mood, Anxiety Symptoms:  Excessive Worry, Psychotic Symptoms:   Denies PTSD Symptoms: NA  Past Psychiatric History: major depression, general anxiety, insomnia  Previous Psychotropic Medications: Yes  Substance Abuse History in the last 12 months:  Yes.    Consequences of Substance Abuse:   Denies  Past Medical History:  Past Medical History:  Diagnosis Date   Depression    Diabetes mellitus without complication (HCC)    Gonorrhea 11/2022   Hydradenitis    Hypertension    Vaginal Pap smear, abnormal     Past Surgical History:  Procedure Laterality Date   WISDOM TOOTH  EXTRACTION      Family Psychiatric History: Denies a family psychiatric history  Family History:  Family History  Problem Relation Age of Onset   Hypertension Mother    Diabetes Mother        boarderline diabetic   Hypertension Father    Diabetes Paternal Grandmother    Diabetes Paternal Grandfather     Social History:   Social History   Socioeconomic History   Marital status: Single    Spouse name: Not on file   Number of children: 0   Years of education: Not on file   Highest education level: Not on file  Occupational History   Occupation: [place]    Comment: sch  Tobacco Use   Smoking status: Never   Smokeless tobacco: Never  Vaping Use   Vaping status: Never Used  Substance and Sexual Activity   Alcohol  use: No   Drug use: Yes    Types: Marijuana   Sexual activity: Yes    Birth control/protection: Pill  Other Topics Concern   Not on file  Social History Narrative   Not on file   Social Drivers of Health   Financial Resource Strain: High Risk (02/29/2024)   Overall Financial Resource Strain (CARDIA)    Difficulty of Paying Living Expenses: Very hard  Food Insecurity: Food Insecurity Present (02/29/2024)   Hunger Vital Sign    Worried About Running Out of Food in the Last Year: Often true    Ran Out of Food in the Last Year: Often true  Transportation Needs: No Transportation Needs (02/29/2024)   PRAPARE - Administrator, Civil Service (Medical): No    Lack of Transportation (Non-Medical): No  Physical Activity: Inactive (02/29/2024)   Exercise Vital Sign    Days of Exercise per Week: 0 days    Minutes of Exercise per Session: 0 min  Stress: Stress Concern Present (02/29/2024)   [employer] of Occupational Health - Occupational Stress Questionnaire    Feeling of Stress : Very much  Social Connections: Socially Isolated (02/29/2024)   Social Connection and Isolation Panel [NHANES]    Frequency of Communication with Friends and Family:  Never    Frequency of Social Gatherings with Friends and Family: Never    Attends Religious Services: More than 4 times per year    Active Member of [employer] or Organizations: No    Attends Banker Meetings: Never    Marital Status: Never married   Allergies:   Allergies  Allergen Reactions   Semaglutide  Nausea And Vomiting   Trulicity [Dulaglutide] Nausea And Vomiting    Metabolic Disorder Labs:  Labs reviewed.  Scheduled to have labs in June 2025 by PCP Lab Results  Component Value Date   HGBA1C 11.3 (H) 02/14/2024   MPG 240 05/09/2020   MPG 335 09/25/2019   No results found for: "PROLACTIN" Lab Results  Component Value Date   CHOL 187 05/09/2020   TRIG 267 (H) 05/09/2020   HDL 38 (L) 05/09/2020   CHOLHDL 4.9 05/09/2020  VLDL 26 12/15/2016   LDLCALC 111 (H) 05/09/2020   LDLCALC 106 (H) 01/26/2019   Lab Results  Component Value Date   TSH 0.890 12/20/2023    Current Medications: Current Outpatient Medications  Medication Sig Dispense Refill   busPIRone (BUSPAR) 5 MG tablet Take 1 tablet (5 mg total) by mouth 3 (three) times daily. 60 tablet 0   Continuous Glucose Sensor (DEXCOM G7 SENSOR) MISC by Does not apply route.     FLUoxetine  (PROZAC ) 10 MG capsule Take 1 capsule (10 mg total) by mouth daily. 30 capsule 0   Glucagon  (GVOKE HYPOPEN  2-PACK) 0.5 MG/0.1ML SOAJ Inject 0.5 mg into the skin as needed. 0.2 mL 3   insulin  lispro (HUMALOG ) 100 UNIT/ML KwikPen Inject Worth insulin  before meals and at hs if BS < 150 - 0 units, 150-199 - 3 units, 200-249 - 6 units, 250-299 - 9 units, 300-349 - 12 units, >350 - 15 units. If >400 call our office with your blood sugar readings. 15 mL 1   Insulin  Pen Needle (BD PEN NEEDLE NANO 2ND GEN) 32G X 4 MM MISC Use as directed to inject Lantus  SQ QD. 100 each 1   Magnesium Glycinate 120 MG CAPS Take 2 capsules by mouth every evening. 90 capsule 1   norethindrone -ethinyl estradiol (LOESTRIN) 1-20 MG-MCG tablet Take 1 tablet by  mouth daily. 84 tablet 4   omeprazole  (PRILOSEC) 20 MG capsule Take 1 capsule (20 mg total) by mouth daily. X 2 weeks then daily as needed 30 capsule 2   Semaglutide ,0.25 or 0.5MG /DOS, 2 MG/3ML SOPN Inject 0.25 mg into the skin once a week. 9 mL 1   traZODone (DESYREL) 50 MG tablet Take 1 tablet (50 mg total) by mouth at bedtime as needed for sleep. 30 tablet 0   TRESIBA  FLEXTOUCH 100 UNIT/ML FlexTouch Pen Inject 20 Units into the skin daily. Increase by 2 units every 3 days if blood sugar over 130     blood glucose meter kit and supplies Dispense based on patient and insurance preference. Use to monitor FSBS 3x daily. Dx: E11.65. (Patient not taking: Reported on 12/20/2023) 1 each 0   Blood Glucose Monitoring Suppl (ACCU-CHEK GUIDE) w/Device KIT 1 Piece by Does not apply route 4 (four) times daily. (Patient not taking: Reported on 12/20/2023) 1 kit 0   glucose blood (ACCU-CHEK GUIDE) test strip Use as instructed qid. E11.65 (Patient not taking: Reported on 12/20/2023) 150 each 2   Lancets MISC Dispense based on patient and insurance preference. Use to monitor FSBS 3x daily. Dx: E11.65. (Patient not taking: Reported on 12/20/2023) 100 each 11   spironolactone (ALDACTONE) 50 MG tablet Take 1 tablet by mouth daily. (Patient not taking: Reported on 04/02/2024)     No current facility-administered medications for this visit.    Musculoskeletal: Strength & Muscle Tone: within normal limits Gait & Station: normal Patient leans: N/A  Psychiatric Specialty Exam: Review of Systems  Constitutional:        No other complaints voiced  Psychiatric/Behavioral:  Positive for dysphoric mood and sleep disturbance. Negative for hallucinations, self-injury and suicidal ideas. The patient is nervous/anxious.   All other systems reviewed and are negative.   Blood pressure 118/84, pulse 89, height 5\' 6"  (1.676 m), weight 223 lb 3.2 oz (101.2 kg), SpO2 98%.Body mass index is 36.03 kg/m.  General Appearance: Casual   Eye Contact:  Good  Speech:  Clear and Coherent and Normal Rate  Volume:  Normal  Mood:  Anxious and Dysphoric  Affect:  Appropriate and Congruent  Thought Process:  Coherent, Goal Directed, and Descriptions of Associations: Intact  Orientation:  Full (Time, Place, and Person)  Thought Content:  WDL and Logical  Suicidal Thoughts:  No  Homicidal Thoughts:  No  Memory:  Immediate;   Good Recent;   Good Remote;   Good  Judgement:  Intact  Insight:  Present  Psychomotor Activity:  Normal  Concentration:  Concentration: Good and Attention Span: Good  Recall:  Good  Fund of Knowledge:Good  Language: Good  Akathisia:  No  Handed:  Right  AIMS (if indicated):  done  Assets:  Communication Skills Desire for Improvement Financial Resources/Insurance Housing Leisure Time Physical Health Resilience Transportation  ADL's:  Intact  Cognition: WNL  Sleep:  Fair   Screenings: AIMS    Flowsheet Row Office Visit from 04/02/2024 in [facility] [place]  AIMS Total Score 0      GAD-7    Flowsheet Row Office Visit from 04/02/2024 in [facility] [place] Office Visit from 02/29/2024 in [facility] Office Visit from 12/20/2023 in [facility] Office Visit from 06/23/2022 in [facility] for [facility] at [facility] Office Visit from 06/12/2021 in [facility]  Total GAD-7 Score 19 21 21 1 2       PHQ2-9    Flowsheet Row Office Visit from 04/02/2024 in [facility] [place] Office Visit from 02/29/2024 in [facility] for [facility] at [facility] Office Visit from 12/20/2023 in [facility] Office Visit from 06[facility]23/2022 in [facility] at [facility] Office Visit from 06/12/2021 in [facility] at [facility]  PHQ-2 Total Score 2 4 5  0 0  PHQ-9 Total Score 16 24 21 1 7       Flowsheet Row Office Visit from 04/02/2024 in [facility] [place] Most recent reading at 04/02/2024 10:22 AM ED from 09/20/2023 in [facility] Most recent reading at 09/20/2023  7:05 PM ED from 09/20/2023 in [facility] at [facility] Most recent reading at 09/20/2023  5:25 PM  C-SSRS RISK CATEGORY Low Risk No Risk Moderate Risk       Assessment and Plan: Assessment: Patient seen and examined as noted above. Summary: Today Maria Aguilar reports worsening depression, anxiety, and unable to sleep.  States past medications did not work but may have been a result of noncompliance.  She denies suicidal/self-harm/homicidal ideation, psychosis, paranoia, and abnormal movement.      During visit she is dressed appropriate for age and weather.  She is seated comfortably in chair with no noted distress.  She is alert/oriented x 4, calm/cooperative and mood is congruent with affect.  She spoke in a clear tone at moderate volume, and normal pace, with good eye contact.  Her thought process is coherent, relevant, and there is no indication that she is currently responding to internal/external stimuli or experiencing delusional thought content.    1. Severe episode of recurrent major depressive disorder, without psychotic features (HCC) (Primary) - FLUoxetine  (PROZAC ) 10 MG capsule; Take 1 capsule (10 mg total) by mouth daily.  Dispense: 30 capsule; Refill: 0 - busPIRone (BUSPAR) 5 MG tablet; Take 1 tablet (5 mg total) by mouth 3 (three) times  daily.  Dispense: 60 tablet; Refill: 0  2. GAD (generalized anxiety disorder) - FLUoxetine  (PROZAC ) 10 MG capsule; Take 1 capsule (10 mg total) by mouth daily.  Dispense: 30 capsule; Refill: 0 - busPIRone (BUSPAR) 5 MG tablet; Take 1 tablet (5 mg total) by  mouth 3 (three) times daily.  Dispense: 60 tablet; Refill: 0  3. Insomnia, unspecified type - traZODone (DESYREL) 50 MG tablet; Take 1 tablet (50 mg total) by mouth at bedtime as needed for sleep.  Dispense: 30 tablet; Refill: 0    Plan: Medications: Meds ordered this encounter  Medications   FLUoxetine  (PROZAC ) 10 MG capsule    Sig: Take 1 capsule (10 mg total) by mouth daily.    Dispense:  30 capsule    Refill:  0    Supervising Provider:   Carlos Chesterfield, SYED T [[id number]]   busPIRone (BUSPAR) 5 MG tablet    Sig: Take 1 tablet (5 mg total) by mouth 3 (three) times daily.    Dispense:  60 tablet    Refill:  0    Supervising Provider:   ARFEEN, SYED T [[id number]]   traZODone (DESYREL) 50 MG tablet    Sig: Take 1 tablet (50 mg total) by mouth at bedtime as needed for sleep.    Dispense:  30 tablet    Refill:  0    Supervising Provider:   Eduard Grad T [[id number]]    Labs:  Reviewed.  No labs indicated at this time  Other:  Follow up on referral for counseling/therapy.   Maria Aguilar is instructed to call [phone], mobile crisis, or present to the nearest emergency room should she experience any suicidal/homicidal ideation, auditory/visual/hallucinations, or detrimental worsening of her mental health condition.   She is advised to reduce marijuana use and to consider quitting Maria Aguilar has participated in the development of this treatment plan and verbalized her agreement with plan as listed.  Follow Up: Return in 2 weeks for medication management Call in the interim for any side-effects, decompensation, questions, or problems  Collaboration of Care: Medication Management AEB Medication prescriptions and Referral or follow-up with counselor/therapist AEB Referral to counseling/therapy  Patient/Guardian was advised Release of Information must be obtained prior to any record release in order to collaborate their care with an outside provider. Patient/Guardian was advised if they have not  already done so to contact the registration department to sign all necessary forms in order for us  to release information regarding their care.   Consent: Patient/Guardian gives verbal consent for treatment and assignment of benefits for services provided during this visit. Patient/Guardian expressed understanding and agreed to proceed.   Maria Rommel, Maria Aguilar 4/28/202511:13 AM

## 2024-04-03 ENCOUNTER — Encounter: Payer: Self-pay | Admitting: Nurse Practitioner

## 2024-04-03 ENCOUNTER — Ambulatory Visit (INDEPENDENT_AMBULATORY_CARE_PROVIDER_SITE_OTHER): Admitting: Nurse Practitioner

## 2024-04-03 VITALS — BP 120/80 | HR 105 | Temp 99.3°F | Ht 66.0 in | Wt 223.4 lb

## 2024-04-03 DIAGNOSIS — R0982 Postnasal drip: Secondary | ICD-10-CM | POA: Diagnosis not present

## 2024-04-03 DIAGNOSIS — R0609 Other forms of dyspnea: Secondary | ICD-10-CM

## 2024-04-03 DIAGNOSIS — J069 Acute upper respiratory infection, unspecified: Secondary | ICD-10-CM | POA: Insufficient documentation

## 2024-04-03 DIAGNOSIS — J00 Acute nasopharyngitis [common cold]: Secondary | ICD-10-CM

## 2024-04-03 DIAGNOSIS — R051 Acute cough: Secondary | ICD-10-CM

## 2024-04-03 DIAGNOSIS — E1165 Type 2 diabetes mellitus with hyperglycemia: Secondary | ICD-10-CM

## 2024-04-03 DIAGNOSIS — R0981 Nasal congestion: Secondary | ICD-10-CM

## 2024-04-03 DIAGNOSIS — Z6834 Body mass index (BMI) 34.0-34.9, adult: Secondary | ICD-10-CM

## 2024-04-03 DIAGNOSIS — J029 Acute pharyngitis, unspecified: Secondary | ICD-10-CM

## 2024-04-03 LAB — POC COVID19 BINAXNOW: SARS Coronavirus 2 Ag: NEGATIVE

## 2024-04-03 LAB — POCT RAPID STREP A (OFFICE): Rapid Strep A Screen: NEGATIVE

## 2024-04-03 MED ORDER — AMOXICILLIN-POT CLAVULANATE 875-125 MG PO TABS: 1.0000 | ORAL_TABLET | Freq: Two times a day (BID) | ORAL | 0 refills | Status: DC

## 2024-04-03 NOTE — Patient Instructions (Signed)
 Continue to monitor blood sugars while sick You can use a Neti pot to relieve the mucus and congestion, also Nasonex OTC would help.

## 2024-04-03 NOTE — Progress Notes (Signed)
 [place], CMA,acting as a Neurosurgeon for [name], FNP.,have documented all relevant documentation on the behalf of [name], FNP,as directed by  [name], FNP while in the presence of [name], FNP.  Subjective:  Patient ID: Maria Aguilar , female    DOB: 1996/10/13 , 28 y.o.   MRN: [id number]  Chief Complaint  Patient presents with   Nasal Congestion   Sore Throat    Patient reports sore throat, sneezing and nasal and chest congestion for 5 days. Patient reports it is getting better but not all the way better. She also reports post nasal drip, and coughing.    Diabetes    Patient still have nausea from ozempic .     HPI  Last Friday throat was scratchy, burning.  Initially thought this was related to gastric reflux, but on Saturday she began sneezing and coughing with severe congestion.  She has had increased SOB with exertion over this past week. Negative for fever,  mucus was blood tinged on Saturday but is now yellow, thick.    She has taken zyzal since this started, it offers mild relief of congestion. States that she has not had anything like this in the past.  No sinus pain, endorses congestion with drainage, fatigue and malaise.  Able to eat and drink normally since start, lost taste and smell sense on Sunday, this has returned but is very dull.  Sick contact with  during Easter holiday.    Blood sugars have been higher this week, 200-250.           Past Medical History:  Diagnosis Date   Depression    Diabetes mellitus without complication (HCC)    Gonorrhea 11/2022   Hydradenitis    Hypertension    Vaginal Pap smear, abnormal      Family History  Problem Relation Age of Onset   Hypertension Mother    Diabetes Mother        boarderline diabetic   Hypertension Father    Diabetes Paternal Grandmother    Diabetes Paternal Grandfather      Current Outpatient Medications:    amoxicillin -clavulanate (AUGMENTIN ) 875-125 MG tablet, Take 1 tablet  by mouth 2 (two) times daily., Disp: 14 tablet, Rfl: 0   busPIRone  (BUSPAR ) 5 MG tablet, Take 1 tablet (5 mg total) by mouth 3 (three) times daily., Disp: 60 tablet, Rfl: 0   Continuous Glucose Sensor (DEXCOM G7 SENSOR) MISC, by Does not apply route., Disp: , Rfl:    FLUoxetine  (PROZAC ) 10 MG capsule, Take 1 capsule (10 mg total) by mouth daily., Disp: 30 capsule, Rfl: 0   Glucagon  (GVOKE HYPOPEN  2-PACK) 0.5 MG/0.1ML SOAJ, Inject 0.5 mg into the skin as needed., Disp: 0.2 mL, Rfl: 3   insulin  lispro (HUMALOG ) 100 UNIT/ML KwikPen, Inject [place] insulin  before meals and at hs if BS < 150 - 0 units, 150-199 - 3 units, 200-249 - 6 units, 250-299 - 9 units, 300-349 - 12 units, >350 - 15 units. If >400 call our office with your blood sugar readings., Disp: 15 mL, Rfl: 1   Insulin  Pen Needle (BD PEN NEEDLE NANO 2ND GEN) 32G X 4 MM MISC, Use as directed to inject Lantus  SQ QD., Disp: 100 each, Rfl: 1   Magnesium Glycinate 120 MG CAPS, Take 2 capsules by mouth every evening., Disp: 90 capsule, Rfl: 1   norethindrone -ethinyl estradiol (LOESTRIN) 1-20 MG-MCG tablet, Take 1 tablet by mouth daily., Disp: 84 tablet, Rfl: 4   omeprazole  (PRILOSEC) 20  MG capsule, Take 1 capsule (20 mg total) by mouth daily. X 2 weeks then daily as needed, Disp: 30 capsule, Rfl: 2   Semaglutide ,0.25 or 0.5MG /DOS, 2 MG/3ML SOPN, Inject 0.25 mg into the skin once a week., Disp: 9 mL, Rfl: 1   traZODone  (DESYREL ) 50 MG tablet, Take 1 tablet (50 mg total) by mouth at bedtime as needed for sleep., Disp: 30 tablet, Rfl: 0   TRESIBA  FLEXTOUCH 100 UNIT/ML FlexTouch Pen, Inject 20 Units into the skin daily. Increase by 2 units every 3 days if blood sugar over 130, Disp: , Rfl:    Albuterol-Budesonide (AIRSUPRA ) 90-80 MCG/ACT AERO, INHALE 2 PUFFS INTO THE LUNGS AS DIRECTED. 2 PUFFS AS NEEDED EVERY 6 HOURS., Disp: 32.1 g, Rfl: 1   blood glucose meter kit and supplies, Dispense based on patient and insurance preference. Use to monitor FSBS 3x daily.  Dx: E11.65. (Patient not taking: Reported on 12/20/2023), Disp: 1 each, Rfl: 0   Blood Glucose Monitoring Suppl (ACCU-CHEK GUIDE) w/Device KIT, 1 Piece by Does not apply route 4 (four) times daily. (Patient not taking: Reported on 04/03/2024), Disp: 1 kit, Rfl: 0   glucose blood (ACCU-CHEK GUIDE) test strip, Use as instructed qid. E11.65 (Patient not taking: Reported on 04/03/2024), Disp: 150 each, Rfl: 2   Lancets MISC, Dispense based on patient and insurance preference. Use to monitor FSBS 3x daily. Dx: E11.65. (Patient not taking: Reported on 12/20/2023), Disp: 100 each, Rfl: 11   predniSONE  (DELTASONE ) 20 MG tablet, Take  2 tablets by mouth daily x 3 days, Disp: 6 tablet, Rfl: 0   spironolactone (ALDACTONE) 50 MG tablet, Take 1 tablet by mouth daily. (Patient not taking: Reported on 02/14/2024), Disp: , Rfl:    Allergies  Allergen Reactions   Semaglutide  Nausea And Vomiting   Trulicity [Dulaglutide] Nausea And Vomiting     Review of Systems  Constitutional:  Positive for diaphoresis and fatigue. Negative for fever.  HENT:  Positive for congestion. Negative for ear discharge, ear pain, sinus pressure and sinus pain.   Eyes:  Positive for discharge.  Respiratory:  Positive for cough and shortness of breath. Negative for chest tightness, wheezing and stridor.   Cardiovascular:  Negative for chest pain and palpitations.  Gastrointestinal:  Negative for constipation, diarrhea and nausea.  Endocrine: Positive for heat intolerance.  Musculoskeletal:  Positive for myalgias (legs and back).  Neurological:  Positive for headaches.     Today's Vitals   04/03/24 1606  BP: 120/80  Pulse: (!) 105  Temp: 99.3 F (37.4 C)  TempSrc: Oral  Weight: 223 lb 6.4 oz (101.3 kg)  Height: 5\' 6"  (1.676 m)  PainSc: 3   PainLoc: Throat   Body mass index is 36.06 kg/m.  Wt Readings from Last 3 Encounters:  04/03/24 223 lb 6.4 oz (101.3 kg)  03/19/24 224 lb (101.6 kg)  02/29/24 223 lb 9.6 oz (101.4 kg)     The ASCVD Risk score (Arnett DK, et al., 2019) failed to calculate for the following reasons:   The 2019 ASCVD risk score is only valid for ages 27 to 40  Objective:  Physical Exam Constitutional:      Appearance: She is well-developed. She is obese.  HENT:     Head: Normocephalic and atraumatic.     Right Ear: Tympanic membrane and ear canal normal.     Left Ear: Tympanic membrane and ear canal normal.     Mouth/Throat:     Mouth: Mucous membranes are moist. Mucous membranes  are pale.     Pharynx: Oropharynx is clear. Uvula midline. No pharyngeal swelling, oropharyngeal exudate or posterior oropharyngeal erythema.     Tonsils: No tonsillar exudate or tonsillar abscesses.  Eyes:     Conjunctiva/sclera: Conjunctivae normal.  Neck:     Thyroid : No thyromegaly.  Cardiovascular:     Rate and Rhythm: Normal rate and regular rhythm.     Heart sounds: Normal heart sounds.  Pulmonary:     Effort: Pulmonary effort is normal. No respiratory distress.     Breath sounds: Decreased air movement present. Examination of the right-upper field reveals decreased breath sounds. Examination of the left-upper field reveals decreased breath sounds. Decreased breath sounds present. No wheezing.  Chest:     Chest wall: No tenderness.  Musculoskeletal:     Cervical back: Normal range of motion and neck supple.  Lymphadenopathy:     Cervical: No cervical adenopathy.  Skin:    General: Skin is warm and dry.     Capillary Refill: Capillary refill takes less than 2 seconds.  Neurological:     General: No focal deficit present.     Mental Status: She is alert and oriented to person, place, and time.  Psychiatric:        Mood and Affect: Mood normal.        Behavior: Behavior normal.         Assessment And Plan:  PND (post-nasal drip)  Acute cough -     POC COVID-19 BinaxNow -     POCT rapid strep A -     DG Chest 2 View; Future -     Amoxicillin -Pot Clavulanate; Take 1 tablet by mouth 2 (two)  times daily.  Dispense: 14 tablet; Refill: 0  Dyspnea on exertion -     DG Chest 2 View; Future -     Amoxicillin -Pot Clavulanate; Take 1 tablet by mouth 2 (two) times daily.  Dispense: 14 tablet; Refill: 0  Body mass index (BMI) of 34.0 to 34.9 in adult Assessment & Plan: She is encouraged to strive for BMI less than 30 to decrease cardiac risk. Advised to aim for at least 150 minutes of exercise per week.    Uncontrolled type 2 diabetes mellitus with hyperglycemia (HCC) Assessment & Plan: Blood sugars are elevated a little more during this time, I have advised her to continue to monitor during her illness.    Viral upper respiratory tract infection Assessment & Plan: Augmentin  prescribed and ordered CXR  Orders: -     POC COVID-19 BinaxNow -     POCT rapid strep A -     DG Chest 2 View; Future -     Amoxicillin -Pot Clavulanate; Take 1 tablet by mouth 2 (two) times daily.  Dispense: 14 tablet; Refill: 0    Return for keep same next.  Patient was given opportunity to ask questions. Patient verbalized understanding of the plan and was able to repeat key elements of the plan. All questions were answered to their satisfaction.   I have reviewed this encounter including the documentation in this note and/or discussed this patient with Mickael Alamo FNP Student. I am certifying that I agree with the content of this note as the primary care nurse practitioner.  Inge Mangle, FNP, have reviewed all documentation for this visit. The documentation on 04/03/24 for the exam, diagnosis, procedures, and orders are all accurate and complete.    IF YOU HAVE BEEN REFERRED TO A SPECIALIST, IT MAY TAKE 1-2 WEEKS  TO SCHEDULE/PROCESS THE REFERRAL. IF YOU HAVE NOT HEARD FROM US /SPECIALIST IN TWO WEEKS, PLEASE GIVE US  A CALL AT [phone].

## 2024-04-04 ENCOUNTER — Ambulatory Visit (HOSPITAL_COMMUNITY)
Admission: RE | Admit: 2024-04-04 | Discharge: 2024-04-04 | Disposition: A | Discharge status: Home / Self Care | Source: Ambulatory Visit | Attending: Nurse Practitioner | Admitting: Nurse Practitioner

## 2024-04-04 DIAGNOSIS — R0981 Nasal congestion: Secondary | ICD-10-CM | POA: Diagnosis present

## 2024-04-04 DIAGNOSIS — R0609 Other forms of dyspnea: Secondary | ICD-10-CM | POA: Diagnosis present

## 2024-04-04 DIAGNOSIS — R051 Acute cough: Secondary | ICD-10-CM | POA: Insufficient documentation

## 2024-04-05 ENCOUNTER — Other Ambulatory Visit: Payer: Self-pay | Admitting: Nurse Practitioner

## 2024-04-05 ENCOUNTER — Ambulatory Visit: Payer: Self-pay

## 2024-04-05 DIAGNOSIS — R0602 Shortness of breath: Secondary | ICD-10-CM

## 2024-04-05 MED ORDER — PREDNISONE 20 MG PO TABS: ORAL_TABLET | ORAL | 0 refills | Status: DC

## 2024-04-05 MED ORDER — AIRSUPRA 90-80 MCG/ACT IN AERO: 2.0000 | INHALATION_SPRAY | RESPIRATORY_TRACT | 1 refills | Status: DC

## 2024-04-05 NOTE — Progress Notes (Signed)
 Sent in airsupra  and prednisone  for SOB still waiting for xray results. Patient encouraged  to go to ER if worsens.

## 2024-04-05 NOTE — Telephone Encounter (Signed)
  Chief Complaint: Moderate SOB, Wheezing and coughing reddish brown from lungs Symptoms: chest feels tight "like I need an inhaler", wheezing, watery eyes Frequency: 03/31/24 Pertinent Negatives: Patient denies fever, dizziness Disposition: [] ED /[] Urgent Care (no appt availability in office) / [] Appointment(In office/virtual)/ []  [facility]/ [] Home Care/ [] Refused Recommended Disposition /[] [place] [facility]/ [x]  Follow-up with PCP Additional Notes: pt wants xray results, she thinks she has pna, offered to make appt- pt stated since seen recently for same issue asked to send message to PCP  Copied from CRM [id number]. Topic: Clinical - Medical Advice >> Apr 05, 2024  3:40 PM Rosaria Common wrote: Reason for CRM: Pt is having SOB. Reason for Disposition  [1] MODERATE difficulty breathing (e.g., speaks in phrases, SOB even at rest, pulse 100-120) AND [2] NEW-onset or WORSE than normal  Answer Assessment - Initial Assessment Questions 1. RESPIRATORY STATUS: "Describe your breathing?" (e.g., wheezing, shortness of breath, unable to speak, severe coughing)      SOB 2. ONSET: "When did this breathing problem begin?"      Sat 3. PATTERN "Does the difficult breathing come and go, or has it been constant since it started?"      Comes and goes  4. SEVERITY: "How bad is your breathing?" (e.g., mild, moderate, severe)    - MILD: No SOB at rest, mild SOB with walking, speaks normally in sentences, can lie down, no retractions, pulse < 100.    - MODERATE: SOB at rest, SOB with minimal exertion and prefers to sit, cannot lie down flat, speaks in phrases, mild retractions, audible wheezing, pulse 100-120.    - SEVERE: Very SOB at rest, speaks in single words, struggling to breathe, sitting hunched forward, retractions, pulse > 120      moderate 8. CAUSE: "What do you think is causing the breathing problem?"      Virus  9. OTHER SYMPTOMS: "Do you have any other symptoms? (e.g., dizziness, runny  nose, cough, chest pain, fever)     Cough, chest pain, wheezing, watery eyes sneezing, chest congestion, sinus congestion-reddish brown  Protocols used: Breathing Difficulty-A-AH

## 2024-04-06 ENCOUNTER — Other Ambulatory Visit: Payer: Self-pay | Admitting: Nurse Practitioner

## 2024-04-06 DIAGNOSIS — R0602 Shortness of breath: Secondary | ICD-10-CM

## 2024-04-09 ENCOUNTER — Encounter: Payer: Self-pay | Admitting: Nurse Practitioner

## 2024-04-10 ENCOUNTER — Ambulatory Visit: Payer: Self-pay

## 2024-04-10 NOTE — Telephone Encounter (Signed)
 Chief Complaint: vomiting Symptoms: vomiting Frequency: x today Pertinent Negatives: Patient denies fever Disposition: [] ED /[] Urgent Care (no appt availability in office) / [] Appointment(In office/virtual)/ []  [place] [facility]/ [x] Home Care/ [] Refused Recommended Disposition /[] [place] [facility]/ [x]  Follow-up with PCP Additional Notes: pt states that she has been having some random vomiting. States she had an episode this morning. States that she will start to feel hot clammy and sweating prior to vomiting. States that when she is vomiting her abd get really painful. Pt states that she doesn't know if this is due to stress at work.  Pt is requesting a referral to GI.   Copied from CRM [id number]. Topic: Clinical - Red Word Triage >> Apr 10, 2024 12:00 PM Rachelle R wrote: [facility] that prompted transfer to Nurse Triage: Patient has been randomly vomiting for the last month. Gets hot and clammy and then vomits, feels like she is having a panic attack when it happens, difficulty breathing. Reason for Disposition  MILD or MODERATE vomiting (e.g., 1 - 5 times / day)  Answer Assessment - Initial Assessment Questions 1. VOMITING SEVERITY: "How many times have you vomited in the past 24 hours?"     - MILD:  1 - 2 times/day    - MODERATE: 3 - 5 times/day, decreased oral intake without significant weight loss or symptoms of dehydration    - SEVERE: 6 or more times/day, vomits everything or nearly everything, with significant weight loss, symptoms of dehydration      mild 2. ONSET: "When did the vomiting begin?"      This morning 3. FLUIDS: "What fluids or food have you vomited up today?" "Have you been able to keep any fluids down?"     yes 4. ABDOMEN PAIN: "Are your having any abdomen pain?" If Yes : "How bad is it and what does it feel like?" (e.g., crampy, dull, intermittent, constant)      no 5. DIARRHEA: "Is there any diarrhea?" If Yes, ask: "How many times today?"      no 6.  CONTACTS: "Is there anyone else in the family with the same symptoms?"      no 7. CAUSE: "What do you think is causing your vomiting?"     stress 8. HYDRATION STATUS: "Any signs of dehydration?" (e.g., dry mouth [not only dry lips], too weak to stand) "When did you last urinate?"     yes 9. OTHER SYMPTOMS: "Do you have any other symptoms?" (e.g., fever, headache, vertigo, vomiting blood or coffee grounds, recent head injury)     no 10. PREGNANCY: "Is there any chance you are pregnant?" "When was your last menstrual period?"       no  Protocols used: Vomiting-A-AH

## 2024-04-15 ENCOUNTER — Encounter: Payer: Self-pay | Admitting: Nurse Practitioner

## 2024-04-15 NOTE — Assessment & Plan Note (Signed)
 Blood sugars are elevated a little more during this time, I have advised her to continue to monitor during her illness.

## 2024-04-15 NOTE — Assessment & Plan Note (Addendum)
 Augmentin  prescribed and ordered CXR, negative rapid strep and rapid covid

## 2024-04-15 NOTE — Assessment & Plan Note (Signed)
 She is encouraged to strive for BMI less than 30 to decrease cardiac risk. Advised to aim for at least 150 minutes of exercise per week.

## 2024-04-16 ENCOUNTER — Ambulatory Visit (INDEPENDENT_AMBULATORY_CARE_PROVIDER_SITE_OTHER): Admitting: Registered Nurse

## 2024-04-16 ENCOUNTER — Encounter (HOSPITAL_COMMUNITY): Payer: Self-pay | Admitting: Registered Nurse

## 2024-04-16 VITALS — BP 138/83 | HR 91 | Ht 66.0 in | Wt 223.2 lb

## 2024-04-16 DIAGNOSIS — F411 Generalized anxiety disorder: Secondary | ICD-10-CM

## 2024-04-16 DIAGNOSIS — Z79899 Other long term (current) drug therapy: Secondary | ICD-10-CM

## 2024-04-16 DIAGNOSIS — F332 Major depressive disorder, recurrent severe without psychotic features: Secondary | ICD-10-CM | POA: Diagnosis not present

## 2024-04-16 DIAGNOSIS — G4700 Insomnia, unspecified: Secondary | ICD-10-CM

## 2024-04-16 MED ORDER — TRAZODONE HCL 50 MG PO TABS
50.0000 mg | ORAL_TABLET | Freq: Every evening | ORAL | Status: DC | PRN
Start: 2024-04-16 — End: 2024-10-09

## 2024-04-16 MED ORDER — FLUOXETINE HCL 10 MG PO CAPS: 10.0000 mg | ORAL_CAPSULE | Freq: Every day | ORAL | Status: DC

## 2024-04-16 MED ORDER — BUSPIRONE HCL 7.5 MG PO TABS: 7.5000 mg | ORAL_TABLET | Freq: Three times a day (TID) | ORAL | 0 refills | Status: DC

## 2024-04-16 NOTE — Patient Instructions (Signed)

## 2024-04-16 NOTE — Progress Notes (Signed)
 [facility] MD/PA/NP OP Progress Note  04/16/2024 12:05 PM Maria Aguilar  MRN:  [id number]  Chief Complaint:  Chief Complaint  Patient presents with   Follow-up    Medication management   HPI: Maria Aguilar 28 y.o. female presents to office today for medication management follow up.  She is seen face to face by this provider, and chart reviewed on 04/16/24.  Her psychiatric history is significant for major depression, general anxiety, and insomnia.  Her mental health is currently managed with BuSpar  5 mg 3 times daily, Prozac  10 mg daily, trazodone  50 mg nightly as needed.  She reports she is unable to tell if there has been any change related to just starting to take her Prozac  3 days ago.  Reports she has been taking the BuSpar  and has noticed a headache at the end of the day but is unsure if the headache is related to the medication.  Reports she has been sleeping well without any difficulty and has not needed the trazodone .  Today she denies suicidal/self-harm/homicidal ideation, paranoia, psychosis, and abnormal movement.  Reports there has been some irritability and easily annoyed.  Recommended the following: Increase BuSpar  to 7.5 mg daily, continue Prozac  20 mg daily and trazodone  50 mg nightly as needed.  Follow-up in 2 weeks, continue counseling/therapy.  She voices understanding with information being given to her today and is agreeable to recommendations.    Visit Diagnosis:    ICD-10-CM   1. On psychotropic medication  Z79.899 CBC with Differential    Comprehensive metabolic panel with GFR    Magnesium    TSH    Lipid panel    Prolactin    hCG, serum, qualitative    2. Severe episode of recurrent major depressive disorder, without psychotic features (HCC)  F33.2 busPIRone  (BUSPAR ) 7.5 MG tablet    FLUoxetine  (PROZAC ) 10 MG capsule    3. GAD (generalized anxiety disorder)  F41.1 busPIRone  (BUSPAR ) 7.5 MG tablet    FLUoxetine  (PROZAC ) 10 MG capsule    4. Insomnia, unspecified type   G47.00 traZODone  (DESYREL ) 50 MG tablet      Past Psychiatric History: major depression, general anxiety, and insomnia.  Past Medical History:  Past Medical History:  Diagnosis Date   Depression    Diabetes mellitus without complication (HCC)    Gonorrhea 11/2022   Hydradenitis    Hypertension    Vaginal Pap smear, abnormal     Past Surgical History:  Procedure Laterality Date   WISDOM TOOTH EXTRACTION      Family Psychiatric History: Denies a family psychiatric history   Family History:  Family History  Problem Relation Age of Onset   Hypertension Mother    Diabetes Mother        boarderline diabetic   Hypertension Father    Diabetes Paternal Grandmother    Diabetes Paternal Grandfather     Social History:  Social History   Socioeconomic History   Marital status: Single    Spouse name: Not on file   Number of children: 0   Years of education: Not on file   Highest education level: Not on file  Occupational History   Occupation: [employer] [place]    Comment: sch  Tobacco Use   Smoking status: Never   Smokeless tobacco: Never  Vaping Use   Vaping status: Never Used  Substance and Sexual Activity   Alcohol  use: No   Drug use: Yes    Types: Marijuana   Sexual activity: Yes  Birth control/protection: Pill  Other Topics Concern   Not on file  Social History Narrative   Not on file   Social Drivers of Health   Financial Resource Strain: High Risk (02/29/2024)   Overall Financial Resource Strain (CARDIA)    Difficulty of Paying Living Expenses: Very hard  Food Insecurity: Food Insecurity Present (02/29/2024)   Hunger Vital Sign    Worried About Running Out of Food in the Last Year: Often true    Ran Out of Food in the Last Year: Often true  Transportation Needs: No Transportation Needs (02/29/2024)   PRAPARE - Administrator, Civil Service (Medical): No    Lack of Transportation (Non-Medical): No  Physical Activity: Inactive (02/29/2024)    Exercise Vital Sign    Days of Exercise per Week: 0 days    Minutes of Exercise per Session: 0 min  Stress: Stress Concern Present (02/29/2024)   [employer] of Occupational Health - Occupational Stress Questionnaire    Feeling of Stress : Very much  Social Connections: Socially Isolated (02/29/2024)   Social Connection and Isolation Panel [NHANES]    Frequency of Communication with Friends and Family: Never    Frequency of Social Gatherings with Friends and Family: Never    Attends Religious Services: More than 4 times per year    Active Member of [employer] or Organizations: No    Attends Banker Meetings: Never    Marital Status: Never married    Allergies:  Allergies  Allergen Reactions   Semaglutide  Nausea And Vomiting   Trulicity [Dulaglutide] Nausea And Vomiting    Metabolic Disorder Labs: Lab Results  Component Value Date   HGBA1C 11.3 (H) 02/14/2024   MPG 240 05/09/2020   MPG 335 09/25/2019   No results found for: "PROLACTIN" Lab Results  Component Value Date   CHOL 187 05/09/2020   TRIG 267 (H) 05/09/2020   HDL 38 (L) 05/09/2020   CHOLHDL 4.9 05/09/2020   VLDL 26 12/15/2016   LDLCALC 111 (H) 05/09/2020   LDLCALC 106 (H) 01/26/2019   Lab Results  Component Value Date   TSH 0.890 12/20/2023   TSH 1.13 09/25/2019   Lab Orders         CBC with Differential         Comprehensive metabolic panel with GFR         Magnesium         TSH         Lipid panel         Prolactin         hCG, serum, qualitative       Current Medications: Current Outpatient Medications  Medication Sig Dispense Refill   Albuterol-Budesonide (AIRSUPRA ) 90-80 MCG/ACT AERO INHALE 2 PUFFS INTO THE LUNGS AS DIRECTED. 2 PUFFS AS NEEDED EVERY 6 HOURS. 32.1 g 1   amoxicillin -clavulanate (AUGMENTIN ) 875-125 MG tablet Take 1 tablet by mouth 2 (two) times daily. 14 tablet 0   busPIRone  (BUSPAR ) 7.5 MG tablet Take 1 tablet (7.5 mg total) by mouth 3 (three) times daily. 60 tablet  0   Continuous Glucose Sensor (DEXCOM G7 SENSOR) MISC by Does not apply route.     FLUoxetine  (PROZAC ) 10 MG capsule Take 1 capsule (10 mg total) by mouth daily.     Glucagon  (GVOKE HYPOPEN  2-PACK) 0.5 MG/0.1ML SOAJ Inject 0.5 mg into the skin as needed. 0.2 mL 3   insulin  lispro (HUMALOG ) 100 UNIT/ML KwikPen Inject [place] insulin  before meals and at  hs if BS < 150 - 0 units, 150-199 - 3 units, 200-249 - 6 units, 250-299 - 9 units, 300-349 - 12 units, >350 - 15 units. If >400 call our office with your blood sugar readings. 15 mL 1   Insulin  Pen Needle (BD PEN NEEDLE NANO 2ND GEN) 32G X 4 MM MISC Use as directed to inject Lantus  SQ QD. 100 each 1   Magnesium Glycinate 120 MG CAPS Take 2 capsules by mouth every evening. 90 capsule 1   norethindrone -ethinyl estradiol (LOESTRIN) 1-20 MG-MCG tablet Take 1 tablet by mouth daily. 84 tablet 4   omeprazole  (PRILOSEC) 20 MG capsule Take 1 capsule (20 mg total) by mouth daily. X 2 weeks then daily as needed 30 capsule 2   predniSONE  (DELTASONE ) 20 MG tablet Take  2 tablets by mouth daily x 3 days 6 tablet 0   Semaglutide ,0.25 or 0.5MG /DOS, 2 MG/3ML SOPN Inject 0.25 mg into the skin once a week. 9 mL 1   TRESIBA  FLEXTOUCH 100 UNIT/ML FlexTouch Pen Inject 20 Units into the skin daily. Increase by 2 units every 3 days if blood sugar over 130     blood glucose meter kit and supplies Dispense based on patient and insurance preference. Use to monitor FSBS 3x daily. Dx: E11.65. (Patient not taking: Reported on 12/20/2023) 1 each 0   Blood Glucose Monitoring Suppl (ACCU-CHEK GUIDE) w/Device KIT 1 Piece by Does not apply route 4 (four) times daily. (Patient not taking: Reported on 12/20/2023) 1 kit 0   glucose blood (ACCU-CHEK GUIDE) test strip Use as instructed qid. E11.65 (Patient not taking: Reported on 12/20/2023) 150 each 2   Lancets MISC Dispense based on patient and insurance preference. Use to monitor FSBS 3x daily. Dx: E11.65. (Patient not taking: Reported on  12/20/2023) 100 each 11   spironolactone (ALDACTONE) 50 MG tablet Take 1 tablet by mouth daily. (Patient not taking: Reported on 04/16/2024)     traZODone  (DESYREL ) 50 MG tablet Take 1 tablet (50 mg total) by mouth at bedtime as needed for sleep.     No current facility-administered medications for this visit.     Musculoskeletal: Strength & Muscle Tone: within normal limits Gait & Station: normal Patient leans: N/A  Psychiatric Specialty Exam: Review of Systems  Constitutional:        No additional complaints voiced  Psychiatric/Behavioral:  Positive for dysphoric mood. Negative for hallucinations, self-injury, sleep disturbance and suicidal ideas. Agitation: Irritability, easily annoyed.The patient is nervous/anxious.   All other systems reviewed and are negative.   Blood pressure 138/83, pulse 91, height 5\' 6"  (1.676 m), weight 223 lb 3.2 oz (101.2 kg), SpO2 96%.Body mass index is 36.03 kg/m.  General Appearance: Casual and Neat  Eye Contact:  Good  Speech:  Clear and Coherent and Normal Rate  Volume:  Normal  Mood:  Euthymic  Affect:  Appropriate and Congruent  Thought Process:  Coherent, Goal Directed, and Descriptions of Associations: Intact  Orientation:  Full (Time, Place, and Person)  Thought Content: WDL and Logical   Suicidal Thoughts:  No  Homicidal Thoughts:  No  Memory:  Immediate;   Good Recent;   Good Remote;   Good  Judgement:  Intact  Insight:  Present  Psychomotor Activity:  Normal  Concentration:  Concentration: Good and Attention Span: Good  Recall:  Good  Fund of Knowledge: Good  Language: Good  Akathisia:  No  Handed:  Right  AIMS (if indicated): not done  Assets:  Communication Skills Desire  for Improvement Financial Resources/Insurance Housing Leisure Time Resilience Social Support Transportation  ADL's:  Intact  Cognition: WNL  Sleep:  Good   Screenings: AIMS    Flowsheet Row Office Visit from 04/02/2024 in [facility] [place]  AIMS Total Score 0      GAD-7    Flowsheet Row Office Visit from 04/02/2024 in [facility] [place] Office Visit from 02/29/2024 in [facility] at [facility] Office Visit from 12/20/2023 in [facility] Office Visit from 06/23/2022 in [facility] Office Visit from 06/12/2021 in [facility]  Total GAD-7 Score 19 21 21 1 2       PHQ2-9    Flowsheet Row Office Visit from 04/02/2024 in [facility] at [place] Office Visit from 02/29/2024 in [facility] for [facility] at [facility] Office Visit from 12/20/2023 in [facility] Office Visit from 06/23/2022 in [facility] for [facility] at [facility] Office Visit from 06/12/2021 in [facility] at [facility]  PHQ-2 Total Score 2 4 5  0 0  PHQ-9 Total Score 16 24 21 1 7       Flowsheet Row Office Visit from 04/02/2024 in [facility] at [place] Most recent reading at 04/02/2024 10:22 AM ED from 09/20/2023 in [facility] Most recent reading at 09/20/2023  7:05 PM ED from 09/20/2023 in [facility] at [facility] Most recent reading at 09/20/2023  5:25 PM  C-SSRS RISK CATEGORY Low Risk No Risk Moderate Risk        Assessment and Plan:  Assessment: Patient seen and examined as noted above. Summary: Today Oluwatosin Friebel appears to be doing fairly well.  Reporting has only been taking Prozac  for 3 days but has been taking the BuSpar  longer and unable to tell if the evening headaches are result of BuSpar  but feels it may be more related to wearing hair too tight or tension.  Discussed continuing current medication regimen and increasing  BuSpar  to 7.5 mg and reassess in 2 weeks.  She denies suicidal/self-harm/homicidal ideation, psychosis, paranoia, and mood fluctuations. During visit she is dressed appropriate for age and weather.  She is seated comfortably in chair with no noted distress.  She is alert/oriented x 4, calm/cooperative and mood is congruent with affect.  She spoke in a clear tone at moderate volume, and normal pace, with good eye contact.  Her thought process is coherent, relevant, and there is no indication that she is currently responding to internal/external stimuli or experiencing delusional thought content.    1. Severe episode of recurrent major depressive disorder, without psychotic features (HCC) - busPIRone  (BUSPAR ) 7.5 MG tablet; Take 1 tablet (7.5 mg total) by mouth 3 (three) times daily.  Dispense: 60 tablet; Refill: 0 - FLUoxetine  (PROZAC ) 10 MG capsule; Take 1 capsule (10 mg total) by mouth daily.  2. GAD (generalized anxiety disorder) - busPIRone  (BUSPAR ) 7.5 MG tablet; Take 1 tablet (7.5 mg total) by mouth 3 (three) times daily.  Dispense: 60 tablet; Refill: 0 - FLUoxetine  (PROZAC ) 10 MG capsule; Take 1 capsule (10 mg total) by mouth daily.  3. Insomnia, unspecified type - traZODone  (DESYREL ) 50 MG tablet; Take 1 tablet (50 mg total) by mouth at bedtime as needed for  sleep.  4. On psychotropic medication (Primary) - CBC with Differential - Comprehensive metabolic panel with GFR - Magnesium - TSH - Lipid panel - Prolactin - hCG, serum, qualitative  Plan: Medications: Meds ordered this encounter  Medications   busPIRone  (BUSPAR ) 7.5 MG tablet    Sig: Take 1 tablet (7.5 mg total) by mouth 3 (three) times daily.    Dispense:  60 tablet    Refill:  0    Supervising Provider:   ARFEEN, SYED T [[id number]]   FLUoxetine  (PROZAC ) 10 MG capsule    Sig: Take 1 capsule (10 mg total) by mouth daily.    Supervising Provider:   Eduard Grad T [[id number]]   traZODone  (DESYREL ) 50 MG tablet    Sig: Take 1  tablet (50 mg total) by mouth at bedtime as needed for sleep.    Supervising Provider:   Eduard Grad T [[id number]]   Lab Orders         CBC with Differential         Comprehensive metabolic panel with GFR         Magnesium         TSH         Lipid panel         Prolactin         hCG, serum, qualitative     Other:  Continue counseling/therapy.   Sahian Delafosse is instructed to call 911, [phone], mobile crisis, or present to the nearest emergency room should she experience any suicidal/homicidal ideation, auditory/visual/hallucinations, or detrimental worsening of her mental health condition.   Linh Deruyter has participated in the development of this treatment plan and verbalized her agreement with plan as listed.  Follow Up: Return in 2 weeks for medication management Call in the interim for any side-effects, decompensation, questions, or problems  Collaboration of Care: Collaboration of Care: Medication Management AEB medication assessment, adjustment, and refill and Other labs ordered  Patient/Guardian was advised Release of Information must be obtained prior to any record release in order to collaborate their care with an outside provider. Patient/Guardian was advised if they have not already done so to contact the registration department to sign all necessary forms in order for us  to release information regarding their care.   Consent: Patient/Guardian gives verbal consent for treatment and assignment of benefits for services provided during this visit. Patient/Guardian expressed understanding and agreed to proceed.    Maize Brittingham, NP 04/16/2024, 12:05 PM

## 2024-04-17 LAB — COMPREHENSIVE METABOLIC PANEL WITH GFR
ALT: 19 IU/L (ref 0–32)
AST: 17 IU/L (ref 0–40)
Albumin: 4.4 g/dL (ref 4.0–5.0)
Alkaline Phosphatase: 80 IU/L (ref 44–121)
BUN/Creatinine Ratio: 11 (ref 9–23)
BUN: 7 mg/dL (ref 6–20)
Bilirubin Total: 0.4 mg/dL (ref 0.0–1.2)
CO2: 19 mmol/L — ABNORMAL LOW (ref 20–29)
Calcium: 9.3 mg/dL (ref 8.7–10.2)
Chloride: 100 mmol/L (ref 96–106)
Creatinine, Ser: 0.62 mg/dL (ref 0.57–1.00)
Globulin, Total: 3.1 g/dL (ref 1.5–4.5)
Glucose: 251 mg/dL — ABNORMAL HIGH (ref 70–99)
Potassium: 4.4 mmol/L (ref 3.5–5.2)
Sodium: 138 mmol/L (ref 134–144)
Total Protein: 7.5 g/dL (ref 6.0–8.5)
eGFR: 125 mL/min/{1.73_m2} (ref >59)

## 2024-04-17 LAB — CBC WITH DIFFERENTIAL/PLATELET
Basophils Absolute: 0 10*3/uL (ref 0.0–0.2)
Basos: 0 %
EOS (ABSOLUTE): 0.1 10*3/uL (ref 0.0–0.4)
Eos: 1 %
Hematocrit: 38.8 % (ref 34.0–46.6)
Hemoglobin: 12.7 g/dL (ref 11.1–15.9)
Immature Grans (Abs): 0 10*3/uL (ref 0.0–0.1)
Immature Granulocytes: 0 %
Lymphocytes Absolute: 3.4 10*3/uL — ABNORMAL HIGH (ref 0.7–3.1)
Lymphs: 42 %
MCH: 27.3 pg (ref 26.6–33.0)
MCHC: 32.7 g/dL (ref 31.5–35.7)
MCV: 83 fL (ref 79–97)
Monocytes Absolute: 0.4 10*3/uL (ref 0.1–0.9)
Monocytes: 5 %
Neutrophils Absolute: 4.2 10*3/uL (ref 1.4–7.0)
Neutrophils: 52 %
Platelets: 385 10*3/uL (ref 150–450)
RBC: 4.66 x10E6/uL (ref 3.77–5.28)
RDW: 12.2 % (ref 11.7–15.4)
WBC: 8.1 10*3/uL (ref 3.4–10.8)

## 2024-04-17 LAB — TSH: TSH: 0.621 u[IU]/mL (ref 0.450–4.500)

## 2024-04-17 LAB — LIPID PANEL
Chol/HDL Ratio: 4.3 ratio (ref 0.0–4.4)
Cholesterol, Total: 199 mg/dL (ref 100–199)
HDL: 46 mg/dL (ref >39)
LDL Chol Calc (NIH): 132 mg/dL — ABNORMAL HIGH (ref 0–99)
Triglycerides: 117 mg/dL (ref 0–149)
VLDL Cholesterol Cal: 21 mg/dL (ref 5–40)

## 2024-04-17 LAB — MAGNESIUM: Magnesium: 1.6 mg/dL (ref 1.6–2.3)

## 2024-04-17 LAB — HCG, SERUM, QUALITATIVE: hCG,Beta Subunit,Qual,Serum: NEGATIVE m[IU]/mL (ref <6)

## 2024-04-17 LAB — PROLACTIN: Prolactin: 5.2 ng/mL (ref 4.8–33.4)

## 2024-04-23 ENCOUNTER — Ambulatory Visit: Payer: Self-pay | Admitting: Nurse Practitioner

## 2024-04-23 VITALS — BP 100/80 | HR 91 | Temp 99.2°F | Ht 66.0 in | Wt 224.0 lb

## 2024-04-23 DIAGNOSIS — Z79899 Other long term (current) drug therapy: Secondary | ICD-10-CM

## 2024-04-23 DIAGNOSIS — Z6836 Body mass index (BMI) 36.0-36.9, adult: Secondary | ICD-10-CM

## 2024-04-23 DIAGNOSIS — T148XXA Other injury of unspecified body region, initial encounter: Secondary | ICD-10-CM | POA: Diagnosis not present

## 2024-04-23 DIAGNOSIS — Z2821 Immunization not carried out because of patient refusal: Secondary | ICD-10-CM

## 2024-04-23 DIAGNOSIS — E1165 Type 2 diabetes mellitus with hyperglycemia: Secondary | ICD-10-CM | POA: Diagnosis not present

## 2024-04-23 DIAGNOSIS — Z0000 Encounter for general adult medical examination without abnormal findings: Secondary | ICD-10-CM

## 2024-04-23 DIAGNOSIS — K219 Gastro-esophageal reflux disease without esophagitis: Secondary | ICD-10-CM

## 2024-04-23 DIAGNOSIS — E66812 Obesity, class 2: Secondary | ICD-10-CM

## 2024-04-23 LAB — POCT URINALYSIS DIP (CLINITEK)
Bilirubin, UA: NEGATIVE
Blood, UA: NEGATIVE
Glucose, UA: 500 mg/dL — AB
Leukocytes, UA: NEGATIVE
Nitrite, UA: NEGATIVE
POC PROTEIN,UA: 30 — AB
Spec Grav, UA: >=1.03 — AB (ref 1.010–1.025)
Urobilinogen, UA: 0.2 U/dL
pH, UA: 6 (ref 5.0–8.0)

## 2024-04-23 MED ORDER — TRESIBA FLEXTOUCH 100 UNIT/ML ~~LOC~~ SOPN: 24.0000 [IU] | PEN_INJECTOR | Freq: Every day | SUBCUTANEOUS | Status: DC

## 2024-04-23 NOTE — Patient Instructions (Signed)
 Call [facility] [phone]

## 2024-04-23 NOTE — Progress Notes (Unsigned)
 [place], CMA,acting as a Neurosurgeon for Maria Epley, FNP.,have documented all relevant documentation on the behalf of Maria Epley, FNP,as directed by  Maria Epley, FNP while in the presence of Maria Epley, FNP.  Subjective:    Patient ID: Maria Aguilar , female    DOB: 20-Jul-1996 , 28 y.o.   MRN: [id number]  Chief Complaint  Patient presents with   Annual Exam    Patient presents today for HM, Patient reports compliance with medication. Patient denies any chest pain, SOB, or headaches.    Gastroesophageal Reflux    Patient she has been having a lot of issues with reflux. She reports she has a lot of hiccups and burps. She reports she understands it could be the ozempic  but she doesn't eat a lot of greasy foods.   Bleeding/Bruising    She reports she is bruising a lot easier now.     HPI  Here for HM. She is followed by Lendia [facility] for her GYN needs. She is taking a probiotic by [facility]. She does not have as much of the vomiting yellow secretions, not as often.     Diabetes She presents for her follow-up diabetic visit. She has type 2 diabetes mellitus. There are no hypoglycemic associated symptoms. Associated symptoms include blurred vision. Pertinent negatives for diabetes include no polydipsia, no polyphagia, no polyuria and no weakness.    Past Medical History:  Diagnosis Date   Depression    Diabetes mellitus without complication (HCC)    Gonorrhea 11/2022   Hydradenitis    Hypertension    Vaginal Pap smear, abnormal      Family History  Problem Relation Age of Onset   Hypertension Mother    Diabetes Mother        boarderline diabetic   Hypertension Father    Diabetes Paternal Grandmother    Diabetes Paternal Grandfather      Current Outpatient Medications:    Albuterol-Budesonide (AIRSUPRA ) 90-80 MCG/ACT AERO, INHALE 2 PUFFS INTO THE LUNGS AS DIRECTED. 2 PUFFS AS NEEDED EVERY 6 HOURS., Disp: 32.1 g, Rfl: 1   busPIRone  (BUSPAR ) 7.5 MG tablet,  Take 1 tablet (7.5 mg total) by mouth 3 (three) times daily., Disp: 60 tablet, Rfl: 0   Continuous Glucose Sensor (DEXCOM G7 SENSOR) MISC, by Does not apply route., Disp: , Rfl:    FLUoxetine  (PROZAC ) 10 MG capsule, Take 1 capsule (10 mg total) by mouth daily., Disp: , Rfl:    Glucagon  (GVOKE HYPOPEN  2-PACK) 0.5 MG/0.1ML SOAJ, Inject 0.5 mg into the skin as needed., Disp: 0.2 mL, Rfl: 3   insulin  lispro (HUMALOG ) 100 UNIT/ML KwikPen, Inject [place] insulin  before meals and at hs if BS < 150 - 0 units, 150-199 - 3 units, 200-249 - 6 units, 250-299 - 9 units, 300-349 - 12 units, >350 - 15 units. If >400 call our office with your blood sugar readings., Disp: 15 mL, Rfl: 1   Insulin  Pen Needle (BD PEN NEEDLE NANO 2ND GEN) 32G X 4 MM MISC, Use as directed to inject Lantus  SQ QD., Disp: 100 each, Rfl: 1   Magnesium Glycinate 120 MG CAPS, Take 2 capsules by mouth every evening., Disp: 90 capsule, Rfl: 1   norethindrone -ethinyl estradiol (LOESTRIN) 1-20 MG-MCG tablet, Take 1 tablet by mouth daily., Disp: 84 tablet, Rfl: 4   omeprazole  (PRILOSEC) 20 MG capsule, Take 1 capsule (20 mg total) by mouth daily. X 2 weeks then daily as needed, Disp: 30 capsule, Rfl: 2   Semaglutide ,0.25  or 0.5MG /DOS, 2 MG/3ML SOPN, Inject 0.25 mg into the skin once a week., Disp: 9 mL, Rfl: 1   traZODone  (DESYREL ) 50 MG tablet, Take 1 tablet (50 mg total) by mouth at bedtime as needed for sleep., Disp: , Rfl:    blood glucose meter kit and supplies, Dispense based on patient and insurance preference. Use to monitor FSBS 3x daily. Dx: E11.65. (Patient not taking: Reported on 12/20/2023), Disp: 1 each, Rfl: 0   Blood Glucose Monitoring Suppl (ACCU-CHEK GUIDE) w/Device KIT, 1 Piece by Does not apply route 4 (four) times daily. (Patient not taking: Reported on 04/23/2024), Disp: 1 kit, Rfl: 0   glucose blood (ACCU-CHEK GUIDE) test strip, Use as instructed qid. E11.65 (Patient not taking: Reported on 04/23/2024), Disp: 150 each, Rfl: 2   Lancets  MISC, Dispense based on patient and insurance preference. Use to monitor FSBS 3x daily. Dx: E11.65. (Patient not taking: Reported on 12/20/2023), Disp: 100 each, Rfl: 11   spironolactone (ALDACTONE) 50 MG tablet, Take 1 tablet by mouth daily. (Patient not taking: Reported on 02/14/2024), Disp: , Rfl:    TRESIBA  FLEXTOUCH 100 UNIT/ML FlexTouch Pen, Inject 24 Units into the skin daily. Increase by 2 units every 3 days if blood sugar over 130, Disp: , Rfl:    Allergies  Allergen Reactions   Semaglutide  Nausea And Vomiting   Trulicity [Dulaglutide] Nausea And Vomiting      The patient states she uses OCP (estrogen/progesterone) for birth control. No LMP recorded (lmp unknown). (Menstrual status: Oral contraceptives).   Negative for Dysmenorrhea and Negative for Menorrhagia. Negative for: breast discharge, breast lump(s), breast pain and breast self exam. Associated symptoms include abnormal vaginal bleeding. Pertinent negatives include abnormal bleeding (hematology), anxiety, decreased libido, depression, difficulty falling sleep, dyspareunia, history of infertility, nocturia, sexual dysfunction, sleep disturbances, urinary incontinence, urinary urgency, vaginal discharge and vaginal itching. Diet: she is unsure if she is eating enough protein. She is eating mostly vegetables. The patient states her exercise level is none, she does not have any barriers to exercising.   The patient's tobacco use is:  Social History   Tobacco Use  Smoking Status Never  Smokeless Tobacco Never   She has been exposed to passive smoke. The patient's alcohol  use is:  Social History   Substance and Sexual Activity  Alcohol  Use No   Additional information: Last pap 02/29/2024, next one scheduled for 02/28/2025 - she had a positive HPV.    Review of Systems  Constitutional: Negative.   Eyes:  Positive for blurred vision.  Respiratory: Negative.    Cardiovascular: Negative.   Gastrointestinal:  Positive for nausea  and vomiting. Negative for abdominal distention, abdominal pain and diarrhea.  Endocrine: Negative for polydipsia, polyphagia and polyuria.  Musculoskeletal:        Leg stiffness/mostly her shins   Skin: Negative.   Allergic/Immunologic: Negative.   Neurological: Negative.  Negative for weakness.  Hematological: Negative.   Psychiatric/Behavioral: Negative.       Today's Vitals   04/23/24 1129  BP: 100/80  Pulse: 91  Temp: 99.2 F (37.3 C)  TempSrc: Oral  Weight: 224 lb (101.6 kg)  Height: 5\' 6"  (1.676 m)  PainSc: 0-No pain   Body mass index is 36.15 kg/m.  Wt Readings from Last 3 Encounters:  04/23/24 224 lb (101.6 kg)  04/03/24 223 lb 6.4 oz (101.3 kg)  03/19/24 224 lb (101.6 kg)     Objective:  Physical Exam Vitals and nursing note reviewed.  Constitutional:  General: She is not in acute distress.    Appearance: Normal appearance. She is well-developed. She is obese.  HENT:     Head: Normocephalic and atraumatic.     Right Ear: Hearing, tympanic membrane, ear canal and external ear normal. There is no impacted cerumen.     Left Ear: Hearing, tympanic membrane, ear canal and external ear normal. There is no impacted cerumen.     Nose: Nose normal.     Mouth/Throat:     Mouth: Mucous membranes are moist.  Eyes:     General: Lids are normal.     Extraocular Movements: Extraocular movements intact.     Conjunctiva/sclera: Conjunctivae normal.     Pupils: Pupils are equal, round, and reactive to light.     Funduscopic exam:    Right eye: No papilledema.        Left eye: No papilledema.  Neck:     Thyroid : No thyroid  mass.     Vascular: No carotid bruit.  Cardiovascular:     Rate and Rhythm: Normal rate and regular rhythm.     Pulses: Normal pulses.     Heart sounds: Normal heart sounds. No murmur heard. Pulmonary:     Effort: Pulmonary effort is normal. No respiratory distress.     Breath sounds: Normal breath sounds. No wheezing.  Chest:     Chest  wall: No mass.  Breasts:    Tanner Score is 5.     Right: Normal. No mass or tenderness.     Left: Normal. No mass or tenderness.  Abdominal:     General: Abdomen is flat. Bowel sounds are normal. There is no distension.     Palpations: Abdomen is soft.     Tenderness: There is no abdominal tenderness.  Genitourinary:    Rectum: Guaiac result negative.  Musculoskeletal:        General: No swelling or tenderness. Normal range of motion.     Cervical back: Full passive range of motion without pain, normal range of motion and neck supple.     Right lower leg: No edema.     Left lower leg: No edema.  Lymphadenopathy:     Upper Body:     Right upper body: No supraclavicular, axillary or pectoral adenopathy.     Left upper body: No supraclavicular, axillary or pectoral adenopathy.  Skin:    General: Skin is warm and dry.     Capillary Refill: Capillary refill takes less than 2 seconds.  Neurological:     General: No focal deficit present.     Mental Status: She is alert and oriented to person, place, and time.     Cranial Nerves: No cranial nerve deficit.     Sensory: No sensory deficit.     Motor: No weakness.  Psychiatric:        Mood and Affect: Mood normal.        Behavior: Behavior normal.        Thought Content: Thought content normal.        Judgment: Judgment normal.     {Perform Simple Foot Exam  Perform Detailed exam:1} Diabetic foot exam was performed with the following findings:   No deformities, ulcerations, or other skin breakdown Normal sensation of 10g monofilament Intact posterior tibialis and dorsalis pedis pulses        Assessment And Plan:     Encounter for annual health examination  Uncontrolled type 2 diabetes mellitus with hyperglycemia (HCC) -  EKG 12-Lead -     POCT URINALYSIS DIP (CLINITEK) -     Microalbumin / creatinine urine ratio -     Hemoglobin A1c -     Tresiba  FlexTouch; Inject 24 Units into the skin daily. Increase by 2 units every  3 days if blood sugar over 130  Gastroesophageal reflux disease without esophagitis -     Ambulatory referral to Gastroenterology  COVID-19 vaccination declined  Other long term (current) drug therapy  Bruising -     VITAMIN D 25 Hydroxy (Vit-D Deficiency, Fractures) -     Von Willebrand panel     Return for 1 year physical, controlled DM check 4 months. Patient was given opportunity to ask questions. Patient verbalized understanding of the plan and was able to repeat key elements of the plan. All questions were answered to their satisfaction.   Maria Epley, FNP  I, Maria Epley, FNP, have reviewed all documentation for this visit. The documentation on 04/23/24 for the exam, diagnosis, procedures, and orders are all accurate and complete.

## 2024-04-25 ENCOUNTER — Ambulatory Visit: Payer: Self-pay | Admitting: Nurse Practitioner

## 2024-04-25 LAB — VITAMIN D 25 HYDROXY (VIT D DEFICIENCY, FRACTURES): Vit D, 25-Hydroxy: 23.5 ng/mL — ABNORMAL LOW (ref 30.0–100.0)

## 2024-04-25 LAB — VON WILLEBRAND PANEL
Factor VIII Activity: 136 % (ref 56–140)
Von Willebrand Ag: 145 % (ref 50–200)
Von Willebrand Factor: 94 % (ref 50–200)

## 2024-04-25 LAB — HEMOGLOBIN A1C
Est. average glucose Bld gHb Est-mCnc: 235 mg/dL
Hgb A1c MFr Bld: 9.8 % — ABNORMAL HIGH (ref 4.8–5.6)

## 2024-04-25 LAB — MICROALBUMIN / CREATININE URINE RATIO
Creatinine, Urine: 239 mg/dL
Microalb/Creat Ratio: 22 mg/g{creat} (ref 0–29)
Microalbumin, Urine: 51.6 ug/mL

## 2024-04-25 LAB — COAG STUDIES INTERP REPORT

## 2024-05-02 DIAGNOSIS — T148XXA Other injury of unspecified body region, initial encounter: Secondary | ICD-10-CM | POA: Insufficient documentation

## 2024-05-02 DIAGNOSIS — Z0000 Encounter for general adult medical examination without abnormal findings: Secondary | ICD-10-CM | POA: Insufficient documentation

## 2024-05-02 NOTE — Assessment & Plan Note (Signed)
 Behavior modifications discussed and diet history reviewed.   Pt will continue to exercise regularly and modify diet with low GI, plant based foods and decrease intake of processed foods.  Recommend intake of daily multivitamin, Vitamin D, and calcium.  Recommend monthly self breast exams for preventive screenings, as well as recommend immunizations that include influenza, TDAP

## 2024-05-02 NOTE — Assessment & Plan Note (Signed)
 She is encouraged to strive for BMI less than 30 to decrease cardiac risk. Advised to aim for at least 150 minutes of exercise per week.

## 2024-05-02 NOTE — Assessment & Plan Note (Signed)
 Various stages of bruising to her legs, will check for clotting disorder

## 2024-05-02 NOTE — Assessment & Plan Note (Signed)

## 2024-05-02 NOTE — Assessment & Plan Note (Signed)
 Will treat with omeprazole, encouraged to avoid fried and fatty foods, spicy foods.

## 2024-05-02 NOTE — Assessment & Plan Note (Addendum)
 HgbA1c is improving slowly, continue current medications. EKG done with NSR HR 87

## 2024-05-03 ENCOUNTER — Telehealth (HOSPITAL_COMMUNITY): Admitting: Registered Nurse

## 2024-05-09 ENCOUNTER — Telehealth (HOSPITAL_COMMUNITY): Payer: Self-pay

## 2024-05-09 NOTE — Telephone Encounter (Signed)
 [facility] in [place] faxed over a refill request for pt's Fluoxetine  HCL 10 MG Capsule. Pt is scheduled for 05/21/24. Please advise.

## 2024-05-21 ENCOUNTER — Telehealth (HOSPITAL_COMMUNITY): Admitting: Registered Nurse

## 2024-05-23 ENCOUNTER — Other Ambulatory Visit (HOSPITAL_COMMUNITY): Payer: Self-pay | Admitting: Registered Nurse

## 2024-05-23 DIAGNOSIS — F332 Major depressive disorder, recurrent severe without psychotic features: Secondary | ICD-10-CM

## 2024-05-23 DIAGNOSIS — F411 Generalized anxiety disorder: Secondary | ICD-10-CM

## 2024-05-23 MED ORDER — FLUOXETINE HCL 10 MG PO CAPS
10.0000 mg | ORAL_CAPSULE | Freq: Every day | ORAL | 0 refills | Status: DC
Start: 2024-05-23 — End: 2024-10-09

## 2024-05-23 NOTE — Telephone Encounter (Signed)
 No it wasn't per chart

## 2024-07-05 ENCOUNTER — Ambulatory Visit: Payer: Self-pay

## 2024-07-05 ENCOUNTER — Telehealth: Payer: Self-pay | Admitting: Nurse Practitioner

## 2024-07-05 DIAGNOSIS — E1165 Type 2 diabetes mellitus with hyperglycemia: Secondary | ICD-10-CM

## 2024-07-05 NOTE — Telephone Encounter (Signed)
 Pt states that she has not been able to check blood glucose since Saturday. But has continued to take her insulin . Pt complaining of new numbness in her feet and blurred vision. Instructed ED.

## 2024-07-05 NOTE — Telephone Encounter (Signed)
 FYI Only or Action Required?: Action required by provider: request for appointment and medication refill request.  Patient was last seen in primary care on 04/23/2024 by Georgina Speaks, FNP.  Called Nurse Triage reporting Neurologic Problem.  Symptoms began a week ago.  Interventions attempted: Nothing.  Symptoms are: unchanged.  Triage Disposition: Go to ED Now (or PCP Triage)  Patient/caregiver understands and will follow disposition?: Yes       Copied from CRM [id number]. Topic: Clinical - Red Word Triage >> Jul 05, 2024 12:54 PM Teressa P wrote: Red Word that prompted transfer to Nurse Triage: feet swollen and numb for a week Reason for Disposition  [1] Numbness or tingling on both sides of body AND [2] is a new symptom present > 24 hours  Patient sounds very sick or weak to the triager  Answer Assessment - Initial Assessment Questions 1. SYMPTOM: What is the main symptom you are concerned about? (e.g., weakness, numbness)     numbness 2. ONSET: When did this start? (e.g., minutes, hours, days; while sleeping)     A week ago 3. LAST NORMAL: When was the last time you (the patient) were normal (no symptoms)?     Over a week ago  4. PATTERN Does this come and go, or has it been constant since it started?  Is it present now?     constant 5. CARDIAC SYMPTOMS: Have you had any of the following symptoms: chest pain, difficulty breathing, palpitations?     no 6. NEUROLOGIC SYMPTOMS: Have you had any of the following symptoms: headache, dizziness, vision loss, double vision, changes in speech, unsteady on your feet?     Blurry vision to happens sporadically for about a week as well  7. OTHER SYMPTOMS: Do you have any other symptoms?     Tips of toes goes red from wearing compression socks., thinks feet are swollen, they feel tight  Protocols used: Neurologic Deficit-A-AH

## 2024-07-05 NOTE — Telephone Encounter (Signed)
 Tried calling pt - NO answer and VM full.

## 2024-07-06 MED ORDER — DEXCOM G7 SENSOR MISC: 3 refills | Status: DC

## 2024-07-06 NOTE — Telephone Encounter (Signed)
 REFILL SENT

## 2024-07-09 ENCOUNTER — Encounter: Payer: Self-pay | Admitting: Nurse Practitioner

## 2024-07-09 ENCOUNTER — Other Ambulatory Visit: Payer: Self-pay

## 2024-07-18 ENCOUNTER — Encounter: Payer: Self-pay | Admitting: Nurse Practitioner

## 2024-07-18 ENCOUNTER — Ambulatory Visit (INDEPENDENT_AMBULATORY_CARE_PROVIDER_SITE_OTHER): Admitting: Nurse Practitioner

## 2024-07-18 VITALS — BP 120/80 | HR 63 | Temp 98.4°F | Ht 66.0 in | Wt 218.0 lb

## 2024-07-18 DIAGNOSIS — E1165 Type 2 diabetes mellitus with hyperglycemia: Secondary | ICD-10-CM | POA: Diagnosis not present

## 2024-07-18 DIAGNOSIS — Z6836 Body mass index (BMI) 36.0-36.9, adult: Secondary | ICD-10-CM

## 2024-07-18 DIAGNOSIS — E782 Mixed hyperlipidemia: Secondary | ICD-10-CM

## 2024-07-18 DIAGNOSIS — I10 Essential (primary) hypertension: Secondary | ICD-10-CM | POA: Diagnosis not present

## 2024-07-18 MED ORDER — DEXCOM G7 SENSOR MISC: 3 refills | Status: DC

## 2024-07-18 MED ORDER — TRESIBA FLEXTOUCH 100 UNIT/ML ~~LOC~~ SOPN: 26.0000 [IU] | PEN_INJECTOR | Freq: Every day | SUBCUTANEOUS | 1 refills | Status: DC

## 2024-07-18 NOTE — Progress Notes (Unsigned)
 [name], CMA,acting as a Neurosurgeon for Gaines Ada, FNP.,have documented all relevant documentation on the behalf of Gaines Ada, FNP,as directed by  Gaines Ada, FNP while in the presence of Gaines Ada, FNP.  Subjective:  Patient ID: Maria Aguilar , female    DOB: 1995/12/12 , 28 y.o.   MRN: [id number]  Chief Complaint  Patient presents with   Diabetes    Patient presents today for a bp and dm follow up, Patient reports compliance with medication. Patient denies any chest pain, SOB, or headaches. Patient has no concerns today.     HPI Discussed the use of AI scribe software for clinical note transcription with the patient, who gave verbal consent to proceed.  History of Present Illness Chyanne Kohut is a 28 year old female with diabetes who presents for follow-up and a referral to endocrinology.  She has been experiencing elevated blood glucose levels, typically 'two hundred and up', despite being on Ozempic  at a dose of 0.25 mg and Tresiba  insulin . She is low on Tresiba  stock and has changed her pharmacy to [facility] in [place]. She has not been increasing her insulin  dose as previously instructed.  She reports new onset blurred vision starting last Thursday and numbness in her feet beginning the Monday before last, which has spread to her hands. She has not yet visited an eye doctor.  She has a history of being hospitalized for three days due to cannabinoid hyperemesis syndrome while on Ozempic  and Trulicity, which she reports caused similar symptoms.  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  Her mother also has diabetes.   She did get a call from [facility] but is unable to get off from work.   Diabetes She presents for her follow-up diabetic visit. She has type 2 diabetes mellitus. There are no hypoglycemic associated symptoms. Associated symptoms include blurred vision and fatigue. Pertinent negatives  for diabetes include no polydipsia, no polyphagia, no polyuria and no weakness. Risk factors for coronary artery disease include obesity, sedentary lifestyle and family history. Current diabetic treatment includes oral agent (dual therapy). She is compliant with treatment some of the time. She has had a previous visit with a dietitian. She rarely participates in exercise. Eye exam is not current.     Past Medical History:  Diagnosis Date   Depression    Diabetes mellitus without complication (HCC)    Gonorrhea 11/2022   Hydradenitis    Hypertension    Vaginal Pap smear, abnormal      Family History  Problem Relation Age of Onset   Hypertension Mother    Diabetes Mother        boarderline diabetic   Hypertension Father    Diabetes Paternal Grandmother    Diabetes Paternal Grandfather      Current Outpatient Medications:    Albuterol-Budesonide (AIRSUPRA ) 90-80 MCG/ACT AERO, INHALE 2 PUFFS INTO THE LUNGS AS DIRECTED. 2 PUFFS AS NEEDED EVERY 6 HOURS., Disp: 32.1 g, Rfl: 1   busPIRone  (BUSPAR ) 7.5 MG tablet, Take 1 tablet (7.5 mg total) by mouth 3 (three) times daily., Disp: 60 tablet, Rfl: 0   FLUoxetine  (PROZAC ) 10 MG capsule, Take 1 capsule (10 mg total) by mouth daily., Disp: 30 capsule, Rfl: 0   Glucagon  (GVOKE HYPOPEN  2-PACK) 0.5 MG/0.1ML SOAJ, Inject 0.5 mg into the skin as needed., Disp: 0.2 mL, Rfl: 3   insulin  lispro (HUMALOG ) 100 UNIT/ML KwikPen, Inject  [place] insulin  before meals and at hs if BS < 150 - 0 units, 150-199 - 3 units, 200-249 - 6 units, 250-299 - 9 units, 300-349 - 12 units, >350 - 15 units. If >400 call our office with your blood sugar readings., Disp: 15 mL, Rfl: 1   Insulin  Pen Needle (BD PEN NEEDLE NANO 2ND GEN) 32G X 4 MM MISC, Use as directed to inject Lantus  SQ QD., Disp: 100 each, Rfl: 1   Magnesium  Glycinate 120 MG CAPS, Take 2 capsules by mouth every evening., Disp: 90 capsule, Rfl: 1   norethindrone -ethinyl estradiol (LOESTRIN) 1-20 MG-MCG tablet, Take 1  tablet by mouth daily., Disp: 84 tablet, Rfl: 4   omeprazole  (PRILOSEC) 20 MG capsule, Take 1 capsule (20 mg total) by mouth daily. X 2 weeks then daily as needed, Disp: 30 capsule, Rfl: 2   Semaglutide ,0.25 or 0.5MG /DOS, 2 MG/3ML SOPN, Inject 0.25 mg into the skin once a week., Disp: 9 mL, Rfl: 1   traZODone  (DESYREL ) 50 MG tablet, Take 1 tablet (50 mg total) by mouth at bedtime as needed for sleep., Disp: , Rfl:    blood glucose meter kit and supplies, Dispense based on patient and insurance preference. Use to monitor FSBS 3x daily. Dx: E11.65. (Patient not taking: Reported on 07/18/2024), Disp: 1 each, Rfl: 0   Blood Glucose Monitoring Suppl (ACCU-CHEK GUIDE) w/Device KIT, 1 Piece by Does not apply route 4 (four) times daily. (Patient not taking: Reported on 07/18/2024), Disp: 1 kit, Rfl: 0   Continuous Glucose Sensor (DEXCOM G7 SENSOR) MISC, USE AS DIRECTED TO CHECK BLOOD SUGARS., Disp: 4 each, Rfl: 3   glucose blood (ACCU-CHEK GUIDE) test strip, Use as instructed qid. E11.65 (Patient not taking: Reported on 07/18/2024), Disp: 150 each, Rfl: 2   Lancets MISC, Dispense based on patient and insurance preference. Use to monitor FSBS 3x daily. Dx: E11.65. (Patient not taking: Reported on 07/18/2024), Disp: 100 each, Rfl: 11   spironolactone (ALDACTONE) 50 MG tablet, Take 1 tablet by mouth daily. (Patient not taking: Reported on 07/18/2024), Disp: , Rfl:    TRESIBA  FLEXTOUCH 100 UNIT/ML FlexTouch Pen, Inject 26 Units into the skin daily. Increase by 2 units every 3 days if blood sugar over 130. MAX DAILY DOSE 50., Disp: 9 mL, Rfl: 1   Allergies  Allergen Reactions   Semaglutide  Nausea And Vomiting   Trulicity [Dulaglutide] Nausea And Vomiting     Review of Systems  Constitutional:  Positive for fatigue.  Eyes:  Positive for blurred vision and visual disturbance (blurred vision).  Respiratory: Negative.    Cardiovascular: Negative.   Endocrine: Negative for polydipsia, polyphagia and polyuria.   Neurological: Negative.  Negative for weakness.  Psychiatric/Behavioral: Negative.       Today's Vitals   07/18/24 1439  BP: 120/80  Pulse: 63  Temp: 98.4 F (36.9 C)  TempSrc: Oral  Weight: 218 lb (98.9 kg)  Height: 5' 6 (1.676 m)  PainSc: 0-No pain   Body mass index is 35.19 kg/m.  Wt Readings from Last 3 Encounters:  07/18/24 218 lb (98.9 kg)  04/23/24 224 lb (101.6 kg)  04/03/24 223 lb 6.4 oz (101.3 kg)    Objective:  Physical Exam Vitals and nursing note reviewed.  Constitutional:      General: She is not in acute distress.    Appearance: Normal appearance.  Cardiovascular:     Rate and Rhythm: Normal rate and regular rhythm.     Pulses: Normal pulses.     Heart  sounds: Normal heart sounds. No murmur heard. Pulmonary:     Effort: Pulmonary effort is normal. No respiratory distress.     Breath sounds: Normal breath sounds. No wheezing.  Musculoskeletal:        General: No swelling or tenderness. Normal range of motion.  Skin:    General: Skin is warm and dry.     Capillary Refill: Capillary refill takes less than 2 seconds.  Neurological:     General: No focal deficit present.     Mental Status: She is alert.     Cranial Nerves: No cranial nerve deficit.     Motor: No weakness.      Assessment And Plan:  Uncontrolled type 2 diabetes mellitus with hyperglycemia (HCC) Assessment & Plan: Type 2 diabetes with persistent hyperglycemia, blood sugars over 200 mg/dL. Complicated by peripheral neuropathy and blurred vision. Current treatment includes insulin  and semaglutide . Her A1c had been improving however blood sugars remain elevated. She has not been increasing her Tresiba  as ordered. - Refer to endocrinologist Dr. Braulio at [facility] for diabetes management. - Increase Tresiba  by 2 units every 3 days if blood sugar is over 130 mg/dL, up to 50 units max. - Perform A1c test to monitor diabetes control. - Advise vitamin B for peripheral neuropathy symptoms. -  Discussed importance of blood sugar control to prevent complications.  Orders: -     Ambulatory referral to Endocrinology -     Hemoglobin A1c -     Dexcom G7 Sensor; USE AS DIRECTED TO CHECK BLOOD SUGARS.  Dispense: 4 each; Refill: 3  Essential hypertension Assessment & Plan: Blood pressure is well controlled, continue current medications.    Mixed hyperlipidemia Assessment & Plan: Stable, continue current medications  Orders: -     Lipid panel -     BMP8+eGFR  BMI 36.0-36.9,adult Assessment & Plan: She is encouraged to strive for BMI less than 30 to decrease cardiac risk. Advised to aim for at least 150 minutes of exercise per week.        No follow-ups on file.  Patient was given opportunity to ask questions. Patient verbalized understanding of the plan and was able to repeat key elements of the plan. All questions were answered to their satisfaction.    LILLETTE Gaines Ada, FNP, have reviewed all documentation for this visit. The documentation on 07/18/24 for the exam, diagnosis, procedures, and orders are all accurate and complete.   IF YOU HAVE BEEN REFERRED TO A SPECIALIST, IT MAY TAKE 1-2 WEEKS TO SCHEDULE/PROCESS THE REFERRAL. IF YOU HAVE NOT HEARD FROM US /SPECIALIST IN TWO WEEKS, PLEASE GIVE US  A CALL AT [phone].

## 2024-07-19 LAB — BMP8+EGFR
BUN/Creatinine Ratio: 15 (ref 9–23)
BUN: 9 mg/dL (ref 6–20)
CO2: 19 mmol/L — ABNORMAL LOW (ref 20–29)
Calcium: 9.6 mg/dL (ref 8.7–10.2)
Chloride: 98 mmol/L (ref 96–106)
Creatinine, Ser: 0.6 mg/dL (ref 0.57–1.00)
Glucose: 270 mg/dL — ABNORMAL HIGH (ref 70–99)
Potassium: 4.4 mmol/L (ref 3.5–5.2)
Sodium: 136 mmol/L (ref 134–144)
eGFR: 126 mL/min/1.73 (ref >59)

## 2024-07-19 LAB — LIPID PANEL
Chol/HDL Ratio: 4.2 ratio (ref 0.0–4.4)
Cholesterol, Total: 198 mg/dL (ref 100–199)
HDL: 47 mg/dL (ref >39)
LDL Chol Calc (NIH): 125 mg/dL — ABNORMAL HIGH (ref 0–99)
Triglycerides: 147 mg/dL (ref 0–149)
VLDL Cholesterol Cal: 26 mg/dL (ref 5–40)

## 2024-07-19 LAB — HEMOGLOBIN A1C
Est. average glucose Bld gHb Est-mCnc: 318 mg/dL
Hgb A1c MFr Bld: 12.7 % — ABNORMAL HIGH (ref 4.8–5.6)

## 2024-07-22 ENCOUNTER — Ambulatory Visit: Payer: Self-pay | Admitting: Nurse Practitioner

## 2024-07-25 ENCOUNTER — Other Ambulatory Visit: Payer: Self-pay

## 2024-07-25 DIAGNOSIS — E1165 Type 2 diabetes mellitus with hyperglycemia: Secondary | ICD-10-CM

## 2024-07-25 MED ORDER — TRESIBA FLEXTOUCH 100 UNIT/ML ~~LOC~~ SOPN: 26.0000 [IU] | PEN_INJECTOR | Freq: Every day | SUBCUTANEOUS | 1 refills | Status: DC

## 2024-07-26 NOTE — Assessment & Plan Note (Signed)
 Type 2 diabetes with persistent hyperglycemia, blood sugars over 200 mg/dL. Complicated by peripheral neuropathy and blurred vision. Current treatment includes insulin  and semaglutide . Her A1c had been improving however blood sugars remain elevated. She has not been increasing her Tresiba  as ordered. - Refer to endocrinologist Dr. Braulio at [facility] for diabetes management. - Increase Tresiba  by 2 units every 3 days if blood sugar is over 130 mg/dL, up to 50 units max. - Perform A1c test to monitor diabetes control. - Advise vitamin B for peripheral neuropathy symptoms. - Discussed importance of blood sugar control to prevent complications.

## 2024-07-26 NOTE — Assessment & Plan Note (Signed)
 Blood pressure is well controlled, continue current medications.

## 2024-07-26 NOTE — Assessment & Plan Note (Signed)
 She is encouraged to strive for BMI less than 30 to decrease cardiac risk. Advised to aim for at least 150 minutes of exercise per week.

## 2024-07-26 NOTE — Assessment & Plan Note (Signed)
 Stable, continue current medications.

## 2024-07-27 ENCOUNTER — Encounter: Payer: Self-pay | Admitting: Radiology

## 2024-08-01 ENCOUNTER — Telehealth: Payer: Self-pay

## 2024-08-01 NOTE — Telephone Encounter (Signed)
 PA for dexcom and tresiba  sent to plan.

## 2024-08-20 ENCOUNTER — Telehealth: Payer: Self-pay | Admitting: Nurse Practitioner

## 2024-08-20 NOTE — Telephone Encounter (Signed)
 Called pt to take [facility] payment no answer left VM

## 2024-08-23 ENCOUNTER — Encounter: Payer: Self-pay | Admitting: Nurse Practitioner

## 2024-08-23 ENCOUNTER — Encounter: Payer: Self-pay | Admitting: Family Medicine

## 2024-08-23 ENCOUNTER — Ambulatory Visit (INDEPENDENT_AMBULATORY_CARE_PROVIDER_SITE_OTHER): Admitting: Family Medicine

## 2024-08-23 VITALS — BP 116/80 | HR 99 | Temp 99.1°F | Ht 66.0 in | Wt 216.0 lb

## 2024-08-23 DIAGNOSIS — J029 Acute pharyngitis, unspecified: Secondary | ICD-10-CM

## 2024-08-23 DIAGNOSIS — Z6835 Body mass index (BMI) 35.0-35.9, adult: Secondary | ICD-10-CM

## 2024-08-23 DIAGNOSIS — J069 Acute upper respiratory infection, unspecified: Secondary | ICD-10-CM

## 2024-08-23 DIAGNOSIS — E66812 Obesity, class 2: Secondary | ICD-10-CM

## 2024-08-23 DIAGNOSIS — R0602 Shortness of breath: Secondary | ICD-10-CM | POA: Diagnosis not present

## 2024-08-23 DIAGNOSIS — J9801 Acute bronchospasm: Secondary | ICD-10-CM

## 2024-08-23 MED ORDER — AIRSUPRA 90-80 MCG/ACT IN AERO: 2.0000 | INHALATION_SPRAY | RESPIRATORY_TRACT | 1 refills | Status: DC

## 2024-08-23 MED ORDER — BENZONATATE 100 MG PO CAPS: 100.0000 mg | ORAL_CAPSULE | Freq: Three times a day (TID) | ORAL | 0 refills | Status: DC | PRN

## 2024-08-23 NOTE — Progress Notes (Signed)
 I,Jameka J Llittleton, CMA,acting as a Neurosurgeon for [employer], NP.,have documented all relevant documentation on the behalf of Bruna Creighton, NP,as directed by  Bruna Creighton, NP while in the presence of Bruna Creighton, NP.  Subjective:  Patient ID: Maria Aguilar , female    DOB: 11-Dec-1995 , 28 y.o.   MRN: [id number]  Chief Complaint  Patient presents with   Sore Throat    Patient presents today for sore throat. Patient reports her throat started hurting yesterday. She has been around her dad who is sick.     HPI Discussed the use of AI scribe software for clinical note transcription with the patient, who gave verbal consent to proceed.  History of Present Illness      Maria Aguilar is a 28 year old female who presents with sore throat and congestion.  She has been experiencing a sore throat and congestion that began yesterday, accompanied by a dry cough, fatigue, and weakness. She feels clammy and hot, with a heavy sensation in her forehead. There is no runny nose, but she notes nasal dryness with a little blood on the right side earlier this week.  She has been taking an off-brand cetirizine, which has somewhat alleviated her throat symptoms. She has noticed clear phlegm with a little yellow in the morning. No fever or body aches are present, but she experienced ear aching yesterday.  Her father and her mother's coworker have been ill, with the coworker having had COVID. Her father has not been tested for COVID, attributing his symptoms to the weather. Her COVID test is negative.  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 She is worried about potentially spreading illness to others at work.  She has a history of diabetes with a recent medication adjustment due to a high A1c. She recently consulted an endocrinologist who adjusted her medication regimen.      Past Medical History:  Diagnosis Date    Depression    Diabetes mellitus without complication (HCC)    Gonorrhea 11/2022   Hydradenitis    Hypertension    Vaginal Pap smear, abnormal      Family History  Problem Relation Age of Onset   Hypertension Mother    Diabetes Mother        boarderline diabetic   Hypertension Father    Diabetes Paternal Grandmother    Diabetes Paternal Grandfather      Current Outpatient Medications:    benzonatate  (TESSALON  PERLES) 100 MG capsule, Take 1 capsule (100 mg total) by mouth 3 (three) times daily as needed., Disp: 30 capsule, Rfl: 0   blood glucose meter kit and supplies, Dispense based on patient and insurance preference. Use to monitor FSBS 3x daily. Dx: E11.65., Disp: 1 each, Rfl: 0   Blood Glucose Monitoring Suppl (ACCU-CHEK GUIDE) w/Device KIT, 1 Piece by Does not apply route 4 (four) times daily., Disp: 1 kit, Rfl: 0   busPIRone  (BUSPAR ) 7.5 MG tablet, Take 1 tablet (7.5 mg total) by mouth 3 (three) times daily., Disp: 60 tablet, Rfl: 0   Continuous Glucose Sensor (DEXCOM G7 SENSOR) MISC, USE AS DIRECTED TO CHECK BLOOD SUGARS., Disp: 4 each, Rfl: 3   FLUoxetine  (PROZAC ) 10 MG capsule, Take 1 capsule (10 mg total) by mouth daily., Disp: 30 capsule, Rfl: 0   Glucagon  (GVOKE HYPOPEN  2-PACK) 0.5 MG/0.1ML SOAJ, Inject 0.5 mg into the skin as needed., Disp: 0.2 mL, Rfl: 3  glucose blood (ACCU-CHEK GUIDE) test strip, Use as instructed qid. E11.65, Disp: 150 each, Rfl: 2   insulin  lispro (HUMALOG ) 100 UNIT/ML KwikPen, Inject [place] insulin  before meals and at hs if BS < 150 - 0 units, 150-199 - 3 units, 200-249 - 6 units, 250-299 - 9 units, 300-349 - 12 units, >350 - 15 units. If >400 call our office with your blood sugar readings., Disp: 15 mL, Rfl: 1   Lancets MISC, Dispense based on patient and insurance preference. Use to monitor FSBS 3x daily. Dx: E11.65., Disp: 100 each, Rfl: 11   Magnesium  Glycinate 120 MG CAPS, Take 2 capsules by mouth every evening., Disp: 90 capsule, Rfl: 1    norethindrone -ethinyl estradiol (LOESTRIN) 1-20 MG-MCG tablet, Take 1 tablet by mouth daily., Disp: 84 tablet, Rfl: 4   omeprazole  (PRILOSEC) 20 MG capsule, Take 1 capsule (20 mg total) by mouth daily. X 2 weeks then daily as needed, Disp: 30 capsule, Rfl: 2   Semaglutide ,0.25 or 0.5MG /DOS, 2 MG/3ML SOPN, Inject 0.25 mg into the skin once a week., Disp: 9 mL, Rfl: 1   spironolactone (ALDACTONE) 50 MG tablet, Take 1 tablet by mouth daily., Disp: , Rfl:    traZODone  (DESYREL ) 50 MG tablet, Take 1 tablet (50 mg total) by mouth at bedtime as needed for sleep., Disp: , Rfl:    TRESIBA  FLEXTOUCH 100 UNIT/ML FlexTouch Pen, Inject 26 Units into the skin daily. Increase by 2 units every 3 days if blood sugar over 130. MAX DAILY DOSE 50., Disp: 9 mL, Rfl: 1   Albuterol-Budesonide (AIRSUPRA ) 90-80 MCG/ACT AERO, Inhale 2 puffs into the lungs as directed. 2 PUFFS AS NEEDED EVERY 6 HOURS., Disp: 32.1 g, Rfl: 1   Insulin  Pen Needle (BD PEN NEEDLE NANO 2ND GEN) 32G X 4 MM MISC, Use as directed to inject Lantus  SQ QD., Disp: 100 each, Rfl: 1   Allergies  Allergen Reactions   Semaglutide  Nausea And Vomiting   Trulicity [Dulaglutide] Nausea And Vomiting     Review of Systems  Constitutional:  Negative for chills and fever.  HENT:  Positive for congestion and sore throat.   Cardiovascular: Negative.   Gastrointestinal: Negative.   Genitourinary: Negative.   Musculoskeletal: Negative.   Skin: Negative.   Psychiatric/Behavioral: Negative.       Today's Vitals   08/23/24 0926  BP: 116/80  Pulse: 99  Temp: 99.1 F (37.3 C)  TempSrc: Oral  Weight: 216 lb (98 kg)  Height: 5' 6 (1.676 m)  PainSc: 0-No pain   Body mass index is 34.86 kg/m.  Wt Readings from Last 3 Encounters:  08/23/24 216 lb (98 kg)  07/18/24 218 lb (98.9 kg)  04/23/24 224 lb (101.6 kg)    The ASCVD Risk score (Arnett DK, et al., 2019) failed to calculate for the following reasons:   The 2019 ASCVD risk score is only valid for  ages 75 to 50  Objective:  Physical Exam Constitutional:      Appearance: Normal appearance.  Cardiovascular:     Rate and Rhythm: Normal rate and regular rhythm.     Pulses: Normal pulses.     Heart sounds: Normal heart sounds.  Pulmonary:     Effort: Pulmonary effort is normal.     Breath sounds: Normal breath sounds.  Abdominal:     General: Bowel sounds are normal.  Neurological:     Mental Status: She is alert.         Assessment And Plan:  Sore throat -  POC COVID-19 BinaxNow  Acute URI -     Benzonatate ; Take 1 capsule (100 mg total) by mouth 3 (three) times daily as needed.  Dispense: 30 capsule; Refill: 0  SOB (shortness of breath) -     Airsupra ; Inhale 2 puffs into the lungs as directed. 2 PUFFS AS NEEDED EVERY 6 HOURS.  Dispense: 32.1 g; Refill: 1  Class 2 severe obesity due to excess calories with serious comorbidity and body mass index (BMI) of 35.0 to 35.9 in adult Assessment & Plan: She is encouraged to strive for BMI less than 30 to decrease cardiac risk. Advised to aim for at least 150 minutes of exercise per week.      Return if symptoms worsen or fail to improve, for keep next app.  Patient was given opportunity to ask questions. Patient verbalized understanding of the plan and was able to repeat key elements of the plan. All questions were answered to their satisfaction.    I, Bruna Creighton, NP, have reviewed all documentation for this visit. The documentation on 09/03/2024 for the exam, diagnosis, procedures, and orders are all accurate and complete.   IF YOU HAVE BEEN REFERRED TO A SPECIALIST, IT MAY TAKE 1-2 WEEKS TO SCHEDULE/PROCESS THE REFERRAL. IF YOU HAVE NOT HEARD FROM US /SPECIALIST IN TWO WEEKS, PLEASE GIVE US  A CALL AT [phone] X [phone].

## 2024-08-27 ENCOUNTER — Ambulatory Visit: Admitting: Nurse Practitioner

## 2024-08-28 ENCOUNTER — Other Ambulatory Visit: Payer: Self-pay

## 2024-08-28 MED ORDER — BD PEN NEEDLE NANO 2ND GEN 32G X 4 MM MISC: 1 refills | Status: AC

## 2024-09-03 DIAGNOSIS — R0602 Shortness of breath: Secondary | ICD-10-CM | POA: Insufficient documentation

## 2024-09-03 DIAGNOSIS — J069 Acute upper respiratory infection, unspecified: Secondary | ICD-10-CM | POA: Insufficient documentation

## 2024-09-03 DIAGNOSIS — J029 Acute pharyngitis, unspecified: Secondary | ICD-10-CM | POA: Insufficient documentation

## 2024-09-03 LAB — POC COVID19 BINAXNOW: SARS Coronavirus 2 Ag: NEGATIVE

## 2024-09-03 NOTE — Assessment & Plan Note (Signed)
 She is encouraged to strive for BMI less than 30 to decrease cardiac risk. Advised to aim for at least 150 minutes of exercise per week.

## 2024-09-20 ENCOUNTER — Encounter (HOSPITAL_COMMUNITY): Payer: Self-pay

## 2024-09-20 ENCOUNTER — Other Ambulatory Visit (HOSPITAL_COMMUNITY): Payer: Self-pay

## 2024-09-20 ENCOUNTER — Other Ambulatory Visit: Payer: Self-pay

## 2024-09-20 MED ORDER — HUMIRA (2 PEN) 40 MG/0.4ML ~~LOC~~ AJKT: AUTO-INJECTOR | SUBCUTANEOUS | 0 refills | Status: DC

## 2024-09-21 ENCOUNTER — Telehealth: Payer: Self-pay

## 2024-09-21 ENCOUNTER — Ambulatory Visit: Payer: Self-pay | Attending: Nurse Practitioner | Admitting: Pharmacist

## 2024-09-21 ENCOUNTER — Other Ambulatory Visit: Payer: Self-pay

## 2024-09-21 ENCOUNTER — Other Ambulatory Visit: Payer: Self-pay | Admitting: Pharmacist

## 2024-09-21 ENCOUNTER — Encounter (HOSPITAL_COMMUNITY): Payer: Self-pay

## 2024-09-21 DIAGNOSIS — Z7189 Other specified counseling: Secondary | ICD-10-CM

## 2024-09-21 MED ORDER — HUMIRA (2 PEN) 40 MG/0.4ML ~~LOC~~ AJKT
AUTO-INJECTOR | SUBCUTANEOUS | 0 refills | Status: DC
  Filled 2024-09-21: qty 1.6, fill #0

## 2024-09-21 NOTE — Progress Notes (Signed)
 Pharmacy Patient Advocate Encounter  Insurance verification completed.   The patient is insured through [facility]   Ran test claim for Humira. PA required.   This test claim was processed through [facility]- copay amounts may vary at other pharmacies due to pharmacy/plan contracts, or as the patient moves through the different stages of their insurance plan.

## 2024-09-21 NOTE — Progress Notes (Signed)
  S: Patient presents for review of their specialty medication therapy.  Patient is currently taking Humira for HS. Patient is managed by Dr. Marciano for this.   Adherence: confirmed  Efficacy: reports good results with the medication   Dosing:  Hidradenitis suppurativa: SubQ: Initial: 160 mg (given as four 40 mg injections on day 1 or given as two 40 mg injections per day over 2 consecutive days), then 80 mg 2 weeks later (day 15). Maintenance: 40 mg every week beginning day 29.  Dose adjustments: Renal: no dose adjustments (has not been studied) Hepatic: no dose adjustments (has not been studied)  Drug-drug interactions: none identified  Screening: TB test: completed  Hepatitis: completed   Monitoring: S/sx of infection: none CBC: results from 04/16/24 nl S/sx of hypersensitivity: none S/sx of malignancy: none S/sx of heart failure: none  Other side effects: none  O:     Lab Results  Component Value Date   WBC 8.1 04/16/2024   HGB 12.7 04/16/2024   HCT 38.8 04/16/2024   MCV 83 04/16/2024   PLT 385 04/16/2024      Chemistry      Component Value Date/Time   NA 136 07/18/2024 1516   K 4.4 07/18/2024 1516   CL 98 07/18/2024 1516   CO2 19 (L) 07/18/2024 1516   BUN 9 07/18/2024 1516   CREATININE 0.60 07/18/2024 1516   CREATININE 0.65 05/09/2020 1127      Component Value Date/Time   CALCIUM 9.6 07/18/2024 1516   ALKPHOS 80 04/16/2024 1135   AST 17 04/16/2024 1135   ALT 19 04/16/2024 1135   BILITOT 0.4 04/16/2024 1135       A/P: 1. Medication review: Patient currently on Humira for HS. Reviewed the medication with the patient, including the following: Humira is a TNF blocking agent indicated for ankylosing spondylitis, Crohn's disease, Hidradenitis suppurativa, psoriatic arthritis, plaque psoriasis, ulcerative colitis, and uveitis. Patient educated on purpose, proper use and potential adverse effects of Humira. Possible adverse effects are  increased risk of infections, headache, and injection site reactions. There is the possibility of an increased risk of malignancy but it is not well understood if this increased risk is due to there medication or the disease state. There are rare cases of pancytopenia and aplastic anemia. For SubQ injection at separate sites in the thigh or lower abdomen (avoiding areas within 2 inches of navel); rotate injection sites. May leave at room temperature for ~15 to 30 minutes prior to use; do not remove cap or cover while allowing product to reach room temperature. Do not use if solution is discolored or contains particulate matter. Do not administer to skin which is red, tender, bruised, hard, or that has scars, stretch marks, or psoriasis plaques. Needle cap of the prefilled syringe or needle cover for the adalimumab pen may contain latex. Prefilled pens and syringes are available for use by patients and the full amount of the syringe should be injected (self-administration); the vial is intended for institutional use only. Vials do not contain a preservative; discard unused portion. No recommendations for any changes at this time.  Herlene Fleeta Morris, PharmD, JAQUELINE, CPP Clinical Pharmacist [facility] [phone]

## 2024-09-21 NOTE — Telephone Encounter (Signed)
 Pharmacy Patient Advocate Encounter   Received notification from Patient Pharmacy that prior authorization for Humira CF is required/requested.   Insurance verification completed.   The patient is insured through [facility]   Per test claim: PA required; PA submitted to above mentioned insurance via Latent Key/confirmation #/EOC [id number] Status is pending

## 2024-09-21 NOTE — Progress Notes (Signed)
 Please see OV from 09/21/2024 for complete documentation.   Maria Aguilar, PharmD, JAQUELINE, CPP Clinical Pharmacist [facility] [phone]

## 2024-09-25 ENCOUNTER — Other Ambulatory Visit: Payer: Self-pay

## 2024-09-25 NOTE — Telephone Encounter (Signed)
 Pharmacy Patient Advocate Encounter  Received notification from [facility] that Prior Authorization for Humira CF has been DENIED.      PA #/Case ID/Reference #: [id number]

## 2024-09-25 NOTE — Progress Notes (Signed)
 PA denied. Faxed denial letter to provider.

## 2024-09-28 ENCOUNTER — Other Ambulatory Visit: Payer: Self-pay

## 2024-09-28 MED ORDER — HUMIRA (2 PEN) 80 MG/0.8ML ~~LOC~~ AJKT: AUTO-INJECTOR | SUBCUTANEOUS | 5 refills | Status: DC

## 2024-10-01 ENCOUNTER — Other Ambulatory Visit: Payer: Self-pay

## 2024-10-02 ENCOUNTER — Other Ambulatory Visit: Payer: Self-pay

## 2024-10-04 ENCOUNTER — Other Ambulatory Visit (HOSPITAL_COMMUNITY): Payer: Self-pay

## 2024-10-04 ENCOUNTER — Ambulatory Visit: Payer: Self-pay

## 2024-10-04 ENCOUNTER — Other Ambulatory Visit: Payer: Self-pay

## 2024-10-04 DIAGNOSIS — E7800 Pure hypercholesterolemia, unspecified: Secondary | ICD-10-CM | POA: Diagnosis not present

## 2024-10-04 DIAGNOSIS — E559 Vitamin D deficiency, unspecified: Secondary | ICD-10-CM | POA: Diagnosis not present

## 2024-10-04 DIAGNOSIS — R635 Abnormal weight gain: Secondary | ICD-10-CM | POA: Diagnosis not present

## 2024-10-04 DIAGNOSIS — E1165 Type 2 diabetes mellitus with hyperglycemia: Secondary | ICD-10-CM | POA: Diagnosis not present

## 2024-10-04 DIAGNOSIS — I10 Essential (primary) hypertension: Secondary | ICD-10-CM | POA: Diagnosis not present

## 2024-10-04 MED ORDER — FLUCONAZOLE 200 MG PO TABS
200.0000 mg | ORAL_TABLET | Freq: Every day | ORAL | 3 refills | Status: DC
  Filled 2024-10-04 – 2024-10-05 (×2): qty 3, 3d supply, fill #0

## 2024-10-04 NOTE — Progress Notes (Signed)
 Spoke with Vickie from the office on 10/23 to advise that insurance will not pay for CF and a new prescription for the regular formulation is needed. She sent a request to the provider to send a new prescription.  Spoke with office again today (10/30) and was told by Vickie that they are still waiting on the provider to sign off on the regular formulation. She has sent another request to provider. Patient is aware.

## 2024-10-04 NOTE — Telephone Encounter (Signed)
  FYI Only or Action Required?: FYI only for provider: appointment scheduled on 10/09/2024.  Patient was last seen in primary care on 08/23/2024 by Petrina Pries, NP.  Called Nurse Triage reporting Numbness.  Symptoms began several months ago.  Interventions attempted: Nothing.  Symptoms are: gradually worsening.  Triage Disposition: See PCP When Office is Open (Within 3 Days)  Patient/caregiver understands and will follow disposition?: Yes Copied from CRM #[id number]. Topic: Clinical - Red Word Triage >> Oct 04, 2024  3:25 PM Hadassah PARAS wrote: Red Word that prompted transfer to Nurse Triage: Numbness on left hand and foot, when putting elbow on table there is radiating pain Reason for Disposition  [1] Weakness of arm / hand, or leg / foot AND [2] is a chronic symptom (recurrent or ongoing AND present > 4 weeks)  Answer Assessment - Initial Assessment Questions 1. SYMPTOM: What is the main symptom you are concerned about? (e.g., weakness, numbness)     Numbness to left foot and left hand 2. ONSET: When did this start? (e.g., minutes, hours, days; while sleeping)     Tree months ago  4. PATTERN Does this come and go, or has it been constant since it started?  Is it present now?     Intermittent numbness to foot and alternates from to left to right foot Constant numbness/tingling to left pinky ring finger, especially if her elbow is is resting on the table 5. CARDIAC SYMPTOMS: Have you had any of the following symptoms: chest pain, difficulty breathing, palpitations?     denies 6. NEUROLOGIC SYMPTOMS: Have you had any of the following symptoms: headache, dizziness, vision loss, double vision, changes in speech, unsteady on your feet?     Admits to blurred vision at times, wears glasses, and attributes to diabetes 7. OTHER SYMPTOMS: Do you have any other symptoms?     denies 8. PREGNANCY: Is there any chance you are pregnant? When was your last menstrual period?      N/a  Protocols used: Neurologic Deficit-A-AH

## 2024-10-04 NOTE — Telephone Encounter (Signed)
 Patient called back to see if her appointment could be changed to a video appointment. I advised due to the symptoms of numbness being seen in person is probably for the best. Patient would like to be contacted if the provider feels like it could be done as a video appointment. Please advise.

## 2024-10-05 ENCOUNTER — Other Ambulatory Visit: Payer: Self-pay

## 2024-10-05 ENCOUNTER — Other Ambulatory Visit (HOSPITAL_COMMUNITY): Payer: Self-pay

## 2024-10-05 MED ORDER — HUMIRA (2 PEN) 80 MG/0.8ML ~~LOC~~ AJKT
AUTO-INJECTOR | SUBCUTANEOUS | 5 refills | Status: DC
  Filled 2024-10-05: qty 1.6, 28d supply, fill #0

## 2024-10-05 NOTE — Telephone Encounter (Signed)
 Pharmacy Patient Advocate Encounter  Received notification from [facility] that Prior Authorization for Humira 40mg /0.55ml has been APPROVED from 09/22/24 to 03/22/25   PA #/Case ID/Reference #: [id number]

## 2024-10-05 NOTE — Progress Notes (Signed)
 Prescription sent in is still Humira 80mg /0.75ml (CF). Called office again requesting the correct prescription (40mg /0.50ml)

## 2024-10-05 NOTE — Progress Notes (Signed)
 New prescription for Humira has been received & profiled in [facility]. Tiffany notified.

## 2024-10-08 ENCOUNTER — Other Ambulatory Visit (HOSPITAL_COMMUNITY): Payer: Self-pay

## 2024-10-08 ENCOUNTER — Other Ambulatory Visit: Payer: Self-pay

## 2024-10-08 ENCOUNTER — Encounter: Payer: Self-pay | Admitting: Radiology

## 2024-10-08 ENCOUNTER — Other Ambulatory Visit: Payer: Self-pay | Admitting: Pharmacist

## 2024-10-08 DIAGNOSIS — F418 Other specified anxiety disorders: Secondary | ICD-10-CM | POA: Diagnosis not present

## 2024-10-08 DIAGNOSIS — F330 Major depressive disorder, recurrent, mild: Secondary | ICD-10-CM | POA: Diagnosis not present

## 2024-10-08 MED ORDER — HUMIRA (2 SYRINGE) 40 MG/0.8ML ~~LOC~~ PSKT
PREFILLED_SYRINGE | SUBCUTANEOUS | 11 refills | Status: DC
  Filled 2024-10-08: qty 3.2, fill #0
  Filled 2024-10-09: qty 3.2, 28d supply, fill #0

## 2024-10-08 MED ORDER — HUMIRA (2 SYRINGE) 40 MG/0.8ML ~~LOC~~ PSKT: PREFILLED_SYRINGE | SUBCUTANEOUS | 11 refills | Status: DC

## 2024-10-08 NOTE — Progress Notes (Signed)
 Provider sent in correct prescription but directions state to inject every week instead of every other week. Reaching out to [facility] to see if she can amend the qty.

## 2024-10-09 ENCOUNTER — Ambulatory Visit: Payer: Self-pay | Admitting: Nurse Practitioner

## 2024-10-09 ENCOUNTER — Other Ambulatory Visit: Payer: Self-pay

## 2024-10-09 ENCOUNTER — Other Ambulatory Visit (HOSPITAL_COMMUNITY): Payer: Self-pay

## 2024-10-09 VITALS — BP 120/74 | HR 96 | Temp 98.5°F | Ht 66.0 in | Wt 236.4 lb

## 2024-10-09 DIAGNOSIS — R202 Paresthesia of skin: Secondary | ICD-10-CM | POA: Diagnosis not present

## 2024-10-09 DIAGNOSIS — F4323 Adjustment disorder with mixed anxiety and depressed mood: Secondary | ICD-10-CM | POA: Diagnosis not present

## 2024-10-09 DIAGNOSIS — R200 Anesthesia of skin: Secondary | ICD-10-CM

## 2024-10-09 DIAGNOSIS — E1165 Type 2 diabetes mellitus with hyperglycemia: Secondary | ICD-10-CM | POA: Diagnosis not present

## 2024-10-09 MED ORDER — CLONAZEPAM 0.5 MG PO TABS
0.5000 mg | ORAL_TABLET | Freq: Every day | ORAL | 0 refills | Status: DC
  Filled 2024-10-09: qty 30, 30d supply, fill #0

## 2024-10-09 MED ORDER — VENLAFAXINE HCL ER 37.5 MG PO CP24
37.5000 mg | ORAL_CAPSULE | Freq: Every day | ORAL | 2 refills | Status: DC
  Filled 2024-10-09: qty 30, 30d supply, fill #0
  Filled 2024-11-08 – 2024-11-19 (×2): qty 30, 30d supply, fill #1

## 2024-10-09 NOTE — Progress Notes (Signed)
 PA amended

## 2024-10-09 NOTE — Progress Notes (Signed)
 [name], CMA,acting as a neurosurgeon for Gaines Ada, FNP.,have documented all relevant documentation on the behalf of Gaines Ada, FNP,as directed by  Gaines Ada, FNP while in the presence of Gaines Ada, FNP.  Subjective:  Patient ID: Maria Aguilar , female    DOB: [birth year] , 28 y.o.   MRN: [id number]  Chief Complaint  Patient presents with   Numbness    Patient presents today for numbness and tingling in her left arm that started about 2 months ago, she reports she saw endo and they told her it could be carpal tunnel. She reports it sometimes she can't straighten her pinky finger. She also reports numbness in her left foot. She reports her last A1c was 10 that was checked last Thursday.    Anxiety    She reports she thinks her anxiety is worsen over time.     HPI Discussed the use of AI scribe software for clinical note transcription with the patient, who gave verbal consent to proceed.  History of Present Illness Maria Aguilar is a 28 year old female who presents with numbness and tingling in her left hand and foot, and worsening anxiety.  She has been experiencing constant numbness and tingling in her left hand for the past two months, with symptoms worsening over time. The symptoms primarily affect her hand, impacting her ability to hold objects, especially while driving. She is right-handed but uses her left hand for driving. Initially, placing her elbow on a surface caused a shooting sensation, which has since improved. She has not used a wrist splint.  She also experiences numbness in her left foot, more pronounced than tingling. Initially, both feet were affected, but the right foot has improved. The numbness is felt throughout the left foot, particularly when pressure is applied, but not on the top of the foot. She has not consulted a podiatrist for this issue.  She is concerned about worsening anxiety. She has been seeing a therapist named Jonell for over a year and  has previously consulted a psychiatrist. She was prescribed buspirone , which caused headaches, and has tried other medications including Prozac , trazodone , sertraline, escitalopram, and citalopram, none of which have been effective. She is not currently taking any medication for anxiety or depression. She experiences mood swings and has panic attacks, especially at home.  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   She is having left hand tingling and numbness constantly. She has not used a wrist splint or brace.   She is being seen at [facility] for therapy and recommends medications. She has also seen a psychiatrist. [phone]    Past Medical History:  Diagnosis Date   Depression    Diabetes mellitus without complication (HCC)    Gonorrhea 11/2022   Hydradenitis    Hypertension    Vaginal Pap smear, abnormal      Family History  Problem Relation Age of Onset   Hypertension Mother    Diabetes Mother        boarderline diabetic   Hypertension Father    Diabetes Paternal Grandmother    Diabetes Paternal Grandfather      Current Outpatient Medications:    Albuterol-Budesonide (AIRSUPRA ) 90-80 MCG/ACT AERO, Inhale 2 puffs into the lungs as directed. 2 PUFFS AS NEEDED EVERY 6 HOURS., Disp: 32.1 g, Rfl: 1   benzonatate  (TESSALON  PERLES) 100 MG capsule, Take 1 capsule (100 mg total) by mouth 3 (  three) times daily as needed., Disp: 30 capsule, Rfl: 0   blood glucose meter kit and supplies, Dispense based on patient and insurance preference. Use to monitor FSBS 3x daily. Dx: E11.65., Disp: 1 each, Rfl: 0   Blood Glucose Monitoring Suppl (ACCU-CHEK GUIDE) w/Device KIT, 1 Piece by Does not apply route 4 (four) times daily., Disp: 1 kit, Rfl: 0   clonazePAM (KLONOPIN) 0.5 MG tablet, Take 1 tablet (0.5 mg total) by mouth at bedtime., Disp: 30 tablet, Rfl: 0   Continuous Glucose Sensor (DEXCOM G7 SENSOR) MISC, USE  AS DIRECTED TO CHECK BLOOD SUGARS., Disp: 4 each, Rfl: 3   fluconazole  (DIFLUCAN ) 200 MG tablet, Take 1 tablet (200 mg total) by mouth daily., Disp: 3 tablet, Rfl: 3   Glucagon  (GVOKE HYPOPEN  2-PACK) 0.5 MG/0.1ML SOAJ, Inject 0.5 mg into the skin as needed., Disp: 0.2 mL, Rfl: 3   glucose blood (ACCU-CHEK GUIDE) test strip, Use as instructed qid. E11.65, Disp: 150 each, Rfl: 2   insulin  lispro (HUMALOG ) 100 UNIT/ML KwikPen, Inject [place] insulin  before meals and at hs if BS < 150 - 0 units, 150-199 - 3 units, 200-249 - 6 units, 250-299 - 9 units, 300-349 - 12 units, >350 - 15 units. If >400 call our office with your blood sugar readings., Disp: 15 mL, Rfl: 1   Insulin  Pen Needle (BD PEN NEEDLE NANO 2ND GEN) 32G X 4 MM MISC, Use as directed to inject Lantus  SQ QD., Disp: 100 each, Rfl: 1   Lancets MISC, Dispense based on patient and insurance preference. Use to monitor FSBS 3x daily. Dx: E11.65., Disp: 100 each, Rfl: 11   Magnesium  Glycinate 120 MG CAPS, Take 2 capsules by mouth every evening., Disp: 90 capsule, Rfl: 1   norethindrone -ethinyl estradiol (LOESTRIN) 1-20 MG-MCG tablet, Take 1 tablet by mouth daily., Disp: 84 tablet, Rfl: 4   omeprazole  (PRILOSEC) 20 MG capsule, Take 1 capsule (20 mg total) by mouth daily. X 2 weeks then daily as needed, Disp: 30 capsule, Rfl: 2   Semaglutide ,0.25 or 0.5MG /DOS, 2 MG/3ML SOPN, Inject 0.25 mg into the skin once a week., Disp: 9 mL, Rfl: 1   spironolactone (ALDACTONE) 50 MG tablet, Take 1 tablet by mouth daily., Disp: , Rfl:    TRESIBA  FLEXTOUCH 100 UNIT/ML FlexTouch Pen, Inject 26 Units into the skin daily. Increase by 2 units every 3 days if blood sugar over 130. MAX DAILY DOSE 50., Disp: 9 mL, Rfl: 1   venlafaxine XR (EFFEXOR XR) 37.5 MG 24 hr capsule, Take 1 capsule (37.5 mg total) by mouth daily., Disp: 30 capsule, Rfl: 2   adalimumab (HUMIRA, 2 PEN,) 40 MG/0.8ML AJKT pen, Inject 0.8 mL (40 mg total) under the skin once a week., Disp: 3.2 mL, Rfl: 11    insulin  aspart (NOVOLOG  FLEXPEN) 100 UNIT/ML FlexPen, Inject 10-20 Units into the skin 2 (two) times daily for sliding scale., Disp: 15 mL, Rfl: 0   insulin  aspart (NOVOLOG  FLEXPEN) 100 UNIT/ML FlexPen, Inject 10-20 Units into the skin 2 (two) times daily sliding scale., Disp: 30 mL, Rfl: 3   insulin  degludec (TRESIBA  FLEXTOUCH) 200 UNIT/ML FlexTouch Pen, Inject 60 Units into the skin daily., Disp: 9 mL, Rfl: 5   insulin  degludec (TRESIBA  FLEXTOUCH) 200 UNIT/ML FlexTouch Pen, Inject up to 60 Units into the skin daily., Disp: 27 mL, Rfl: 3   Allergies  Allergen Reactions   Semaglutide  Nausea And Vomiting   Trulicity [Dulaglutide] Nausea And Vomiting     Review of Systems  Constitutional: Negative.   Respiratory: Negative.    Cardiovascular: Negative.   Neurological: Negative.   Psychiatric/Behavioral: Negative.       Today's Vitals   10/09/24 1443  BP: 120/74  Pulse: 96  Temp: 98.5 F (36.9 C)  TempSrc: Oral  Weight: 236 lb 6.4 oz (107.2 kg)  Height: 5' 6 (1.676 m)  PainSc: 0-No pain   Body mass index is 38.16 kg/m.  Wt Readings from Last 3 Encounters:  10/09/24 236 lb 6.4 oz (107.2 kg)  08/23/24 216 lb (98 kg)  07/18/24 218 lb (98.9 kg)     Objective:  Physical Exam Vitals and nursing note reviewed.  Constitutional:      General: She is not in acute distress.    Appearance: Normal appearance.  Cardiovascular:     Rate and Rhythm: Normal rate and regular rhythm.     Pulses: Normal pulses.     Heart sounds: Normal heart sounds. No murmur heard. Pulmonary:     Effort: Pulmonary effort is normal. No respiratory distress.     Breath sounds: Normal breath sounds. No wheezing.  Musculoskeletal:        General: No swelling or tenderness. Normal range of motion.  Skin:    General: Skin is warm and dry.     Capillary Refill: Capillary refill takes less than 2 seconds.  Neurological:     General: No focal deficit present.     Mental Status: She is alert.     Cranial  Nerves: No cranial nerve deficit.     Motor: No weakness.  Psychiatric:        Mood and Affect: Mood normal. Affect is tearful.        Behavior: Behavior normal.        Thought Content: Thought content normal.        Judgment: Judgment normal.         Assessment And Plan:  Numbness and tingling in left arm Assessment & Plan: Differential includes carpal tunnel syndrome. No prior specialist consultation for hand or foot. - Ordered EMG nerve conduction study to assess for carpal tunnel syndrome. - Referred to hand specialist for further evaluation of left hand symptoms. - Advised taking vitamin B supplements. - Checked vitamin B and thyroid  levels. - I have encouraged her to use her other hand to drive as well since having difficulty with her left hand.   Orders: -     Ambulatory referral to Neurology  Uncontrolled type 2 diabetes mellitus with hyperglycemia (HCC) Assessment & Plan: Continue f/u with Endocrinology   Situational mixed anxiety and depressive disorder Assessment & Plan: Increased anxiety and mood swings. Previous medications ineffective or caused side effects. Discussed potential use of Effexor extended release. Clonazepam prescribed with caution regarding dependency and driving restrictions. Emphasized comprehensive treatment plan and collaboration with licensed clinical social worker. - Prescribed Effexor extended release. - Prescribed clonazepam with caution regarding dependency and driving restrictions. - Coordinated with licensed clinical social worker for medication management and follow-up. - Advised follow-up in 4-6 weeks for medication evaluation.  Orders: -     Venlafaxine HCl ER; Take 1 capsule (37.5 mg total) by mouth daily.  Dispense: 30 capsule; Refill: 2 -     clonazePAM; Take 1 tablet (0.5 mg total) by mouth at bedtime.  Dispense: 30 tablet; Refill: 0  Numbness of left foot -     Ambulatory referral to Neurology    Return for keep same next.   Patient was given opportunity to  ask questions. Patient verbalized understanding of the plan and was able to repeat key elements of the plan. All questions were answered to their satisfaction.   LILLETTE Gaines Ada, FNP, have reviewed all documentation for this visit. The documentation on 10/09/24 for the exam, diagnosis, procedures, and orders are all accurate and complete.   IF YOU HAVE BEEN REFERRED TO A SPECIALIST, IT MAY TAKE 1-2 WEEKS TO SCHEDULE/PROCESS THE REFERRAL. IF YOU HAVE NOT HEARD FROM US /SPECIALIST IN TWO WEEKS, PLEASE GIVE US  A CALL AT [phone].

## 2024-10-09 NOTE — Progress Notes (Signed)
 Specialty Pharmacy Initial Fill Coordination Note  Maria Aguilar is a 28 y.o. female contacted today regarding initial fill of specialty medication(s) Adalimumab (Humira (2 Syringe))   Patient requested Delivery   Delivery date: 10/10/24   Verified address: [address]   Medication will be filled on: 10/09/24   Patient is aware of $0 copayment.

## 2024-10-10 ENCOUNTER — Other Ambulatory Visit: Payer: Self-pay

## 2024-10-10 MED ORDER — NOVOLOG FLEXPEN 100 UNIT/ML ~~LOC~~ SOPN: 10.0000 [IU] | PEN_INJECTOR | Freq: Two times a day (BID) | SUBCUTANEOUS | 3 refills | Status: DC

## 2024-10-10 MED ORDER — TRESIBA FLEXTOUCH 200 UNIT/ML ~~LOC~~ SOPN
60.0000 [IU] | PEN_INJECTOR | Freq: Every day | SUBCUTANEOUS | 5 refills | Status: AC
  Filled 2024-10-10: qty 9, 30d supply, fill #0

## 2024-10-10 MED ORDER — NOVOLOG FLEXPEN 100 UNIT/ML ~~LOC~~ SOPN
10.0000 [IU] | PEN_INJECTOR | Freq: Two times a day (BID) | SUBCUTANEOUS | 0 refills | Status: DC
  Filled 2024-10-10: qty 15, 37d supply, fill #0

## 2024-10-10 MED ORDER — TRESIBA FLEXTOUCH 200 UNIT/ML ~~LOC~~ SOPN
60.0000 [IU] | PEN_INJECTOR | Freq: Every day | SUBCUTANEOUS | 3 refills | Status: DC
  Filled 2024-10-10: qty 27, 90d supply, fill #0

## 2024-10-11 ENCOUNTER — Other Ambulatory Visit: Payer: Self-pay

## 2024-10-11 ENCOUNTER — Other Ambulatory Visit: Payer: Self-pay | Admitting: Pharmacist

## 2024-10-11 ENCOUNTER — Encounter: Payer: Self-pay | Admitting: Nurse Practitioner

## 2024-10-11 ENCOUNTER — Other Ambulatory Visit (HOSPITAL_COMMUNITY): Payer: Self-pay

## 2024-10-11 DIAGNOSIS — R200 Anesthesia of skin: Secondary | ICD-10-CM | POA: Insufficient documentation

## 2024-10-11 MED ORDER — HUMIRA (2 PEN) 40 MG/0.8ML ~~LOC~~ AJKT
AUTO-INJECTOR | SUBCUTANEOUS | 11 refills | Status: AC
  Filled 2024-10-11: qty 3.2, fill #0
  Filled 2024-10-12: qty 3.2, 28d supply, fill #0
  Filled 2024-10-31 – 2024-11-08 (×3): qty 3.2, 28d supply, fill #1
  Filled 2024-12-03 – 2024-12-13 (×2): qty 3.2, 28d supply, fill #2
  Filled 2025-01-03: qty 3.2, 28d supply, fill #3

## 2024-10-11 MED ORDER — HUMIRA (2 PEN) 40 MG/0.8ML ~~LOC~~ AJKT: AUTO-INJECTOR | SUBCUTANEOUS | 11 refills | Status: DC

## 2024-10-11 NOTE — Assessment & Plan Note (Signed)
Continue f/u with Endocrinology.

## 2024-10-11 NOTE — Assessment & Plan Note (Addendum)
 Differential includes carpal tunnel syndrome. No prior specialist consultation for hand or foot. - Ordered EMG nerve conduction study to assess for carpal tunnel syndrome. - Referred to hand specialist for further evaluation of left hand symptoms. - Advised taking vitamin B supplements. - Checked vitamin B and thyroid  levels. - I have encouraged her to use her other hand to drive as well since having difficulty with her left hand.

## 2024-10-11 NOTE — Assessment & Plan Note (Signed)
 Increased anxiety and mood swings. Previous medications ineffective or caused side effects. Discussed potential use of Effexor extended release. Clonazepam prescribed with caution regarding dependency and driving restrictions. Emphasized comprehensive treatment plan and collaboration with licensed clinical social worker. - Prescribed Effexor extended release. - Prescribed clonazepam with caution regarding dependency and driving restrictions. - Coordinated with licensed clinical social worker for medication management and follow-up. - Advised follow-up in 4-6 weeks for medication evaluation.

## 2024-10-12 ENCOUNTER — Other Ambulatory Visit: Payer: Self-pay

## 2024-10-12 ENCOUNTER — Other Ambulatory Visit (HOSPITAL_COMMUNITY): Payer: Self-pay

## 2024-10-12 NOTE — Progress Notes (Signed)
 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 Insurance override was placed to dispense pens which will be delivered with same day courier today, signature required. Patient is aware to properly dispose of syringes.

## 2024-10-15 DIAGNOSIS — F330 Major depressive disorder, recurrent, mild: Secondary | ICD-10-CM | POA: Diagnosis not present

## 2024-10-15 DIAGNOSIS — F418 Other specified anxiety disorders: Secondary | ICD-10-CM | POA: Diagnosis not present

## 2024-10-17 ENCOUNTER — Other Ambulatory Visit: Payer: Self-pay

## 2024-10-22 DIAGNOSIS — F418 Other specified anxiety disorders: Secondary | ICD-10-CM | POA: Diagnosis not present

## 2024-10-22 DIAGNOSIS — F330 Major depressive disorder, recurrent, mild: Secondary | ICD-10-CM | POA: Diagnosis not present

## 2024-10-24 ENCOUNTER — Other Ambulatory Visit: Payer: Self-pay

## 2024-10-29 DIAGNOSIS — F330 Major depressive disorder, recurrent, mild: Secondary | ICD-10-CM | POA: Diagnosis not present

## 2024-10-29 DIAGNOSIS — F418 Other specified anxiety disorders: Secondary | ICD-10-CM | POA: Diagnosis not present

## 2024-10-30 ENCOUNTER — Ambulatory Visit: Admitting: Adult Health

## 2024-10-30 ENCOUNTER — Other Ambulatory Visit: Payer: Self-pay

## 2024-10-30 ENCOUNTER — Encounter: Payer: Self-pay | Admitting: Adult Health

## 2024-10-30 VITALS — BP 133/90 | HR 89 | Temp 98.9°F | Ht 66.0 in | Wt 236.5 lb

## 2024-10-30 DIAGNOSIS — R102 Pelvic and perineal pain unspecified side: Secondary | ICD-10-CM

## 2024-10-30 DIAGNOSIS — Z3202 Encounter for pregnancy test, result negative: Secondary | ICD-10-CM | POA: Diagnosis not present

## 2024-10-30 DIAGNOSIS — R197 Diarrhea, unspecified: Secondary | ICD-10-CM

## 2024-10-30 DIAGNOSIS — R110 Nausea: Secondary | ICD-10-CM

## 2024-10-30 DIAGNOSIS — N939 Abnormal uterine and vaginal bleeding, unspecified: Secondary | ICD-10-CM | POA: Diagnosis not present

## 2024-10-30 DIAGNOSIS — N920 Excessive and frequent menstruation with regular cycle: Secondary | ICD-10-CM | POA: Insufficient documentation

## 2024-10-30 DIAGNOSIS — R030 Elevated blood-pressure reading, without diagnosis of hypertension: Secondary | ICD-10-CM | POA: Diagnosis not present

## 2024-10-30 DIAGNOSIS — Z3041 Encounter for surveillance of contraceptive pills: Secondary | ICD-10-CM | POA: Insufficient documentation

## 2024-10-30 LAB — POCT URINE PREGNANCY: Preg Test, Ur: NEGATIVE

## 2024-10-30 MED ORDER — ONDANSETRON HCL 4 MG PO TABS: 4.0000 mg | ORAL_TABLET | Freq: Three times a day (TID) | ORAL | 1 refills | Status: AC | PRN

## 2024-10-30 MED ORDER — NORETHINDRONE ACET-ETHINYL EST 1-20 MG-MCG PO TABS
1.0000 | ORAL_TABLET | Freq: Every day | ORAL | 3 refills | Status: DC
  Filled 2024-10-30: qty 63, 63d supply, fill #0

## 2024-10-30 NOTE — Progress Notes (Addendum)
 Subjective:     Patient ID: Maria Aguilar, female   DOB: [birth year], 28 y.o.   MRN: [id number]  HPI Maria Aguilar is a 28 year old black female, single, G0P0, in complaining of spotting, nausea, cramping and diarrhea since starting Larin  Fe was on Aureovela 21, started last Wednesday.  She had switched drug stores, said techs [facility] rude.     Component Value Date/Time   DIAGPAP - Low grade squamous intraepithelial lesion (LSIL) (A) 02/29/2024 1511   DIAGPAP  06/12/2021 1343    - Negative for intraepithelial lesion or malignancy (NILM)   HPVHIGH Positive (A) 02/29/2024 1511   ADEQPAP  02/29/2024 1511    Satisfactory for evaluation; transformation zone component PRESENT.   ADEQPAP  06/12/2021 1343    Satisfactory for evaluation; transformation zone component PRESENT.   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 Reviewed medications and allergies.      Objective:   Physical Exam BP (!) 133/90 (BP Location: Right Arm, Patient Position: Sitting, Cuff Size: Large)   Pulse 89   Temp 98.9 F (37.2 C)   Ht 5' 6 (1.676 m)   Wt 236 lb 8 oz (107.3 kg)   LMP  (LMP Unknown)   BMI 38.17 kg/m  UPT is negative Skin warm and dry.Lungs: clear to ausculation bilaterally. Cardiovascular: regular rate and rhythm.   Upstream - 10/30/24 1122       Pregnancy Intention Screening   Does the patient want to become pregnant in the next year? No    Does the patient's partner want to become pregnant in the next year? No      Contraception Wrap Up   Current Method Oral Contraceptive    End Method Oral Contraceptive    Contraception Counseling Provided Yes             Assessment:     1. Negative pregnancy test  - POCT urine pregnancy  2. Spotting (Primary) Spotting with Larin   3. Pelvic cramping  4. Diarrhea, unspecified type Can  try Imodium AD   5. Nausea Will rx zofran  for nausea and it may help with diarrhea Discussed should may have bug that just happened at time of taking new BCP  Meds ordered this encounter  Medications   DISCONTD: norethindrone -ethinyl estradiol  (LOESTRIN ) 1-20 MG-MCG tablet    Sig: Take 1 tablet by mouth daily.    Dispense:  63 tablet    Refill:  3    Supervising Provider:   JAYNE MINDER H [[id number]]   ondansetron  (ZOFRAN ) 4 MG tablet    Sig: Take 1 tablet (4 mg total) by mouth every 8 (eight) hours as needed.    Dispense:  30 tablet    Refill:  1    Supervising Provider:   JAYNE, LUTHER H [[id number]]     6. Encounter for surveillance of contraceptive pills Discussed in great detail that Larin  and aureovela same generic medication, that Larin  has iron, just don't take, but she says she was good with Aureovela, so [facility] and [facility], so it was called in for her Called [facility]ovela [facility] [place],  give 3 packs with 3 refills Use condoms     7. Elevated BP with diagnosis of hypertension Was better on recheck, but I think she was upset about the pills and her symptoms   Plan:  Follow up prn

## 2024-10-31 ENCOUNTER — Other Ambulatory Visit: Payer: Self-pay

## 2024-10-31 ENCOUNTER — Other Ambulatory Visit (HOSPITAL_COMMUNITY): Payer: Self-pay

## 2024-10-31 MED ORDER — INSULIN LISPRO (1 UNIT DIAL) 100 UNIT/ML (KWIKPEN)
10.0000 [IU] | PEN_INJECTOR | Freq: Three times a day (TID) | SUBCUTANEOUS | 5 refills | Status: AC
  Filled 2024-10-31: qty 15, 30d supply, fill #0
  Filled 2024-11-26: qty 15, 20d supply, fill #0

## 2024-11-02 ENCOUNTER — Other Ambulatory Visit (HOSPITAL_COMMUNITY): Payer: Self-pay

## 2024-11-05 DIAGNOSIS — F330 Major depressive disorder, recurrent, mild: Secondary | ICD-10-CM | POA: Diagnosis not present

## 2024-11-05 DIAGNOSIS — F418 Other specified anxiety disorders: Secondary | ICD-10-CM | POA: Diagnosis not present

## 2024-11-06 ENCOUNTER — Other Ambulatory Visit: Payer: Self-pay

## 2024-11-08 ENCOUNTER — Other Ambulatory Visit: Payer: Self-pay | Admitting: Pharmacy Technician

## 2024-11-08 ENCOUNTER — Other Ambulatory Visit: Payer: Self-pay

## 2024-11-08 ENCOUNTER — Encounter: Payer: Self-pay | Admitting: Pharmacist

## 2024-11-08 NOTE — Progress Notes (Signed)
 Specialty Pharmacy Refill Coordination Note  Maria Aguilar is a 28 y.o. female contacted today regarding refills of specialty medication(s) Adalimumab  (Humira  (2 Syringe))   Patient requested (Patient-Rptd) Delivery   Delivery date: 11/13/2024 Verified address: (Patient-Rptd) [address] [place] [address]   Medication will be filled on: 11/12/2024

## 2024-11-09 ENCOUNTER — Other Ambulatory Visit: Payer: Self-pay

## 2024-11-09 MED ORDER — INSULIN PEN NEEDLE 32G X 4 MM MISC
1.0000 | Freq: Every day | 4 refills | Status: AC
  Filled 2024-11-09: qty 100, 90d supply, fill #0

## 2024-11-12 ENCOUNTER — Other Ambulatory Visit: Payer: Self-pay

## 2024-11-12 DIAGNOSIS — F418 Other specified anxiety disorders: Secondary | ICD-10-CM | POA: Diagnosis not present

## 2024-11-12 DIAGNOSIS — F330 Major depressive disorder, recurrent, mild: Secondary | ICD-10-CM | POA: Diagnosis not present

## 2024-11-13 ENCOUNTER — Other Ambulatory Visit: Payer: Self-pay

## 2024-11-14 ENCOUNTER — Other Ambulatory Visit: Payer: Self-pay

## 2024-11-15 ENCOUNTER — Encounter: Payer: Self-pay | Admitting: Internal Medicine

## 2024-11-16 ENCOUNTER — Other Ambulatory Visit: Payer: Self-pay

## 2024-11-20 ENCOUNTER — Other Ambulatory Visit: Payer: Self-pay

## 2024-11-20 MED ORDER — DEXCOM G7 SENSOR MISC
5 refills | Status: DC
  Filled 2024-11-20 – 2024-11-26 (×2): qty 9, 90d supply, fill #0
  Filled 2024-11-27: qty 3, 30d supply, fill #0

## 2024-11-26 ENCOUNTER — Other Ambulatory Visit: Payer: Self-pay

## 2024-11-26 DIAGNOSIS — F330 Major depressive disorder, recurrent, mild: Secondary | ICD-10-CM | POA: Diagnosis not present

## 2024-11-26 DIAGNOSIS — F418 Other specified anxiety disorders: Secondary | ICD-10-CM | POA: Diagnosis not present

## 2024-11-26 MED ORDER — FREESTYLE LIBRE 3 PLUS SENSOR MISC
3 refills | Status: DC
  Filled 2024-11-26: qty 1, 15d supply, fill #0
  Filled 2024-11-26: qty 6, 84d supply, fill #0
  Filled 2024-11-26: qty 2, 28d supply, fill #0
  Filled 2024-11-27: qty 6, 84d supply, fill #1
  Filled 2024-11-27: qty 2, 30d supply, fill #1

## 2024-11-27 ENCOUNTER — Other Ambulatory Visit: Payer: Self-pay

## 2024-12-03 ENCOUNTER — Other Ambulatory Visit (HOSPITAL_COMMUNITY): Payer: Self-pay

## 2024-12-05 ENCOUNTER — Other Ambulatory Visit: Payer: Self-pay

## 2024-12-07 ENCOUNTER — Other Ambulatory Visit: Payer: Self-pay

## 2024-12-12 LAB — OPHTHALMOLOGY REPORT-SCANNED

## 2024-12-13 ENCOUNTER — Other Ambulatory Visit: Payer: Self-pay

## 2024-12-13 NOTE — Progress Notes (Signed)
 Specialty Pharmacy Refill Coordination Note  Maria Aguilar is a 29 y.o. female contacted today regarding refills of specialty medication(s) Adalimumab  (Humira  (2 Pen))   Patient requested Delivery   Delivery date: 12/14/24   Verified address: [address] [place] [address]   Medication will be filled on: 12/13/24

## 2024-12-14 ENCOUNTER — Other Ambulatory Visit: Payer: Self-pay | Admitting: Medical Genetics

## 2024-12-18 ENCOUNTER — Other Ambulatory Visit: Payer: Self-pay

## 2024-12-20 ENCOUNTER — Other Ambulatory Visit: Payer: Self-pay

## 2024-12-20 ENCOUNTER — Ambulatory Visit: Admitting: Internal Medicine

## 2024-12-20 ENCOUNTER — Encounter: Payer: Self-pay | Admitting: Internal Medicine

## 2024-12-20 VITALS — BP 124/82 | HR 102 | Resp 18 | Ht 66.0 in | Wt 234.3 lb

## 2024-12-20 DIAGNOSIS — L732 Hidradenitis suppurativa: Secondary | ICD-10-CM | POA: Diagnosis not present

## 2024-12-20 DIAGNOSIS — F419 Anxiety disorder, unspecified: Secondary | ICD-10-CM | POA: Diagnosis not present

## 2024-12-20 DIAGNOSIS — E782 Mixed hyperlipidemia: Secondary | ICD-10-CM | POA: Diagnosis not present

## 2024-12-20 DIAGNOSIS — K219 Gastro-esophageal reflux disease without esophagitis: Secondary | ICD-10-CM

## 2024-12-20 DIAGNOSIS — Z23 Encounter for immunization: Secondary | ICD-10-CM

## 2024-12-20 DIAGNOSIS — Z794 Long term (current) use of insulin: Secondary | ICD-10-CM | POA: Diagnosis not present

## 2024-12-20 DIAGNOSIS — E1165 Type 2 diabetes mellitus with hyperglycemia: Secondary | ICD-10-CM | POA: Diagnosis not present

## 2024-12-20 MED ORDER — FREESTYLE LIBRE 3 PLUS SENSOR MISC
6 refills | Status: DC
  Filled 2024-12-20: qty 2, fill #0

## 2024-12-20 NOTE — Progress Notes (Signed)
 "  New Patient Office Visit  Subjective    Patient ID: Maria Aguilar, female    DOB: 1995/12/30  Age: 29 y.o. MRN: [id number]  CC:  Chief Complaint  Patient presents with   Establish Care   Diabetes   Sinusitis    HPI Maria Aguilar presents to establish care.  Discussed the use of AI scribe software for clinical note transcription with the patient, who gave verbal consent to proceed.  History of Present Illness Maria Aguilar is a 29 year old female with diabetes who presents for establishing care and management of her diabetes.  She was diagnosed with diabetes around 2016-2017 in adolescence and is unsure if it is type 1 or type 2, though prior testing suggested type 2. Her blood sugars are often high and labile despite Tresiba  60 units every morning and Humalog  18 units in the morning, 11-14 units at lunch, and 18 units at night. Her A1c decreased from 12.7 in August to 10 in October. She uses a [employer] for glucose monitoring but sometimes skips parts of her regimen on weekends.  She has numbness in her fingers and left foot, previously attributed to possible diabetic neuropathy versus cubital tunnel syndrome, with a neurology visit planned.  She uses weekly Humira  for hidradenitis suppurativa with good effect and takes Alesse 1/20 for menstrual regulation.  She has anxiety and has tried Effexor , Prozac , and Lexapro without benefit and is interested in genetic testing to guide further treatment.  Her family history includes blood pressure problems, diabetes, and possible cancer in her grandfather. She is allergic to Ozempic  and Trulicity, which caused severe nausea and vomiting.   Diabetes, Type 2: -First diagnosed in 2016  -Last A1c 12.7% 8/25 -Medications: Tresiba  60 units, Novolog  10-20 units at meals -Patient is compliant with the above medications and reports no side effects.  -Failed Meds: GLP-1's, could not tolerate due to GI side effects -Foot exam:  Due -Microalbumin: Due -Statin: None -Denies symptoms of hypoglycemia, polyuria, polydipsia, numbness extremities, foot ulcers/trauma.   HS: -Following with Dermatology, [facility] -Currently on Humeria once weekly  Menorrhagia: -Currently on Aurovela  OCP -Following with Gynecology   GERD: -Prilosec 20 mg PRN  MDD: -Mood status: uncontrolled -Current treatment: Nothing currently  -Previous psychiatric medications: buspar , effexor , lexapro, prozac , and zoloft     12/20/2024    2:44 PM 10/09/2024    3:36 PM 07/18/2024    2:59 PM 04/02/2024   10:22 AM 02/29/2024    3:05 PM  Depression screen PHQ 2/9  Decreased Interest 1 0 2  1  Down, Depressed, Hopeless 2 1 1  3   PHQ - 2 Score 3 1 3  4   Altered sleeping 3 3 3  3   Tired, decreased energy 1 2 3  3   Change in appetite 3 3 1  3   Feeling bad or failure about yourself  1 1 1  3   Trouble concentrating 2 3 3  3   Moving slowly or fidgety/restless 0 2 1  3   Suicidal thoughts 0 1 1  2   PHQ-9 Score 13 16  16   24    Difficult doing work/chores Very difficult Extremely dIfficult Extremely dIfficult       Information is confidential and restricted. Go to Review Flowsheets to unlock data.   Data saved with a previous flowsheet row definition   Health Maintenance: -Blood work due -Pap HPV positive, LSIL s/p colposcopy, repeat in 1 year  -HPV vaccine due  Outpatient Encounter Medications as of 12/20/2024  Medication Sig   adalimumab  (HUMIRA , 2 PEN,) 40 MG/0.8ML AJKT pen Inject 0.8 mL (40 mg total) under the skin once a week.   AUROVELA  1/20 1-20 MG-MCG tablet Take 1 tablet by mouth daily.   blood glucose meter kit and supplies Dispense based on patient and insurance preference. Use to monitor FSBS 3x daily. Dx: E11.65.   Blood Glucose Monitoring Suppl (ACCU-CHEK GUIDE) w/Device KIT 1 Piece by Does not apply route 4 (four) times daily.   Continuous Glucose Sensor (FREESTYLE LIBRE 3 PLUS SENSOR) MISC Place on back of arm every 15 days    Glucagon  (GVOKE HYPOPEN  2-PACK) 0.5 MG/0.1ML SOAJ Inject 0.5 mg into the skin as needed.   glucose blood (ACCU-CHEK GUIDE) test strip Use as instructed qid. E11.65   insulin  aspart (NOVOLOG  FLEXPEN) 100 UNIT/ML FlexPen Inject 10-20 Units into the skin 2 (two) times daily for sliding scale.   insulin  aspart (NOVOLOG  FLEXPEN) 100 UNIT/ML FlexPen Inject 10-20 Units into the skin 2 (two) times daily sliding scale.   insulin  degludec (TRESIBA  FLEXTOUCH) 200 UNIT/ML FlexTouch Pen Inject 60 Units into the skin daily.   insulin  degludec (TRESIBA  FLEXTOUCH) 200 UNIT/ML FlexTouch Pen Inject up to 60 Units into the skin daily.   insulin  lispro (HUMALOG ) 100 UNIT/ML KwikPen Inject 10-25 Units into the skin 3 (three) times daily before meals (max daily dose 50 units)   Insulin  Pen Needle (BD PEN NEEDLE NANO 2ND GEN) 32G X 4 MM MISC Use as directed to inject Lantus  SQ QD.   Insulin  Pen Needle 32G X 4 MM MISC Use 1 each daily.   Lancets MISC Dispense based on patient and insurance preference. Use to monitor FSBS 3x daily. Dx: E11.65.   omeprazole  (PRILOSEC) 20 MG capsule Take 1 capsule (20 mg total) by mouth daily. X 2 weeks then daily as needed   ondansetron  (ZOFRAN ) 4 MG tablet Take 1 tablet (4 mg total) by mouth every 8 (eight) hours as needed.   [DISCONTINUED] TRESIBA  FLEXTOUCH 100 UNIT/ML FlexTouch Pen Inject 26 Units into the skin daily. Increase by 2 units every 3 days if blood sugar over 130. MAX DAILY DOSE 50. (Patient taking differently: Inject 60 Units into the skin daily. Increase by 2 units every 3 days if blood sugar over 130. MAX DAILY DOSE 50.)   clonazePAM  (KLONOPIN ) 0.5 MG tablet Take 1 tablet (0.5 mg total) by mouth at bedtime. (Patient not taking: Reported on 12/20/2024)   Continuous Glucose Sensor (DEXCOM G7 SENSOR) MISC Place 1 new device every 10 days.   Continuous Glucose Sensor (DEXCOM G7 SENSOR) MISC USE AS DIRECTED TO CHECK BLOOD SUGARS.   venlafaxine  XR (EFFEXOR  XR) 37.5 MG 24 hr capsule  Take 1 capsule (37.5 mg total) by mouth daily. (Patient not taking: Reported on 12/20/2024)   No facility-administered encounter medications on file as of 12/20/2024.    Past Medical History:  Diagnosis Date   Allergy    Anxiety    Depression    Diabetes mellitus without complication (HCC)    GERD (gastroesophageal reflux disease)    Gonorrhea 11/2022   Hydradenitis    Hypertension    Neuromuscular disorder (HCC)    Vaginal Pap smear, abnormal     Past Surgical History:  Procedure Laterality Date   WISDOM TOOTH EXTRACTION      Family History  Problem Relation Age of Onset   Hypertension Mother    Diabetes Mother        boarderline diabetic   Hypertension Father  Diabetes Paternal Grandmother    Diabetes Paternal Grandfather     Social History   Socioeconomic History   Marital status: Single    Spouse name: Not on file   Number of children: 0   Years of education: Not on file   Highest education level: Bachelor's degree (e.g., BA, AB, BS)  Occupational History   Occupation: [place]    Comment: sch  Tobacco Use   Smoking status: Never   Smokeless tobacco: Never  Vaping Use   Vaping status: Never Used  Substance and Sexual Activity   Alcohol  use: Not Currently    Alcohol /week: 1.0 standard drink of alcohol     Types: 1 Glasses of wine per week   Drug use: Yes    Types: Marijuana    Comment: I would like to stop usage   Sexual activity: Not Currently    Birth control/protection: Abstinence, Pill  Other Topics Concern   Not on file  Social History Narrative   Not on file   Social Drivers of Health   Tobacco Use: Low Risk (12/20/2024)   Patient History    Smoking Tobacco Use: Never    Smokeless Tobacco Use: Never    Passive Exposure: Not on file  Financial Resource Strain: Medium Risk (12/20/2024)   Overall Financial Resource Strain (CARDIA)    Difficulty of Paying Living Expenses: Somewhat hard  Food Insecurity: Food Insecurity Present (12/20/2024)    Epic    Worried About Programme Researcher, Broadcasting/film/video in the Last Year: Often true    Barista in the Last Year: Often true  Transportation Needs: No Transportation Needs (12/20/2024)   Epic    Lack of Transportation (Medical): No    Lack of Transportation (Non-Medical): No  Physical Activity: Inactive (12/20/2024)   Exercise Vital Sign    Days of Exercise per Week: 0 days    Minutes of Exercise per Session: 0 min  Stress: Stress Concern Present (12/20/2024)   [employer] of Occupational Health - Occupational Stress Questionnaire    Feeling of Stress: Very much  Social Connections: Socially Isolated (12/20/2024)   Social Connection and Isolation Panel    Frequency of Communication with Friends and Family: Never    Frequency of Social Gatherings with Friends and Family: Once a week    Attends Religious Services: More than 4 times per year    Active Member of [employer] or Organizations: No    Attends Banker Meetings: Never    Marital Status: Never married  Intimate Partner Violence: Not At Risk (12/20/2024)   Epic    Fear of Current or Ex-Partner: No    Emotionally Abused: No    Physically Abused: No    Sexually Abused: No  Depression (PHQ2-9): High Risk (12/20/2024)   Depression (PHQ2-9)    PHQ-2 Score: 13  Alcohol  Screen: Low Risk (12/20/2024)   Alcohol  Screen    Last Alcohol  Screening Score (AUDIT): 0  Housing: High Risk (12/20/2024)   Epic    Unable to Pay for Housing in the Last Year: Yes    Number of Times Moved in the Last Year: Not on file    Homeless in the Last Year: Yes  Utilities: Not At Risk (12/20/2024)   Epic    Threatened with loss of utilities: No  Health Literacy: Adequate Health Literacy (12/20/2024)   B1300 Health Literacy    Frequency of need for help with medical instructions: Never    Review of Systems  All other  systems reviewed and are negative.       Objective    BP 124/82   Pulse (!) 102   Resp 18   Ht 5' 6 (1.676 m)   Wt 234  lb 4.8 oz (106.3 kg)   SpO2 98%   BMI 37.82 kg/m   Physical Exam Constitutional:      Appearance: Normal appearance.  HENT:     Head: Normocephalic and atraumatic.  Eyes:     Extraocular Movements: Extraocular movements intact.     Conjunctiva/sclera: Conjunctivae normal.     Pupils: Pupils are equal, round, and reactive to light.  Neck:     Comments: No thyromegaly Cardiovascular:     Rate and Rhythm: Normal rate and regular rhythm.  Pulmonary:     Effort: Pulmonary effort is normal.     Breath sounds: Normal breath sounds.  Musculoskeletal:     Cervical back: No tenderness.     Right lower leg: No edema.     Left lower leg: No edema.  Lymphadenopathy:     Cervical: No cervical adenopathy.  Skin:    General: Skin is warm and dry.  Neurological:     General: No focal deficit present.     Mental Status: She is alert. Mental status is at baseline.  Psychiatric:        Mood and Affect: Mood normal.        Behavior: Behavior normal.         Assessment & Plan:   Assessment & Plan Diabetes mellitus with diabetic polyneuropathy Diabetes possibly type 1.5 with improved A1c from 12.7 to 10. Symptoms suggest diabetic neuropathy. Discussed insulin  pump benefits and emphasized glucose control to prevent neuropathy progression. - Ordered labs to determine type of diabetes (type 1 vs type 2). - Continue Tresiba  60 units daily in the morning. - Continue Humalog  with meals using sliding scale. - Discussed potential for insulin  pump with Isaiah. - Refilled Freestyle Libre sensors. - Educated on consistent insulin  administration with meals. - Advised on lifestyle modifications: increase protein intake, stay hydrated, avoid sugary beverages.  Hidradenitis suppurativa Significant improvement with weekly Humira . - Continue Humira  weekly.  Anxiety disorder Limited success with Effexor , Prozac , and Lexapro. Discussed gene site testing and psychiatry referral. - Discuss gene site  testing with benefits team for insurance coverage. - Referred to psychiatry for further management.  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 Normal colposcopy in April. Discussed HPV vaccination importance. - Checked HPV vaccination status. - Administered first dose of HPV vaccine if not previously vaccinated.  General Health Maintenance Reviewed vaccination status. Tetanus up to date. Discussed HPV vaccination due to previous HPV infection. - Administered first dose of HPV vaccine if not previously vaccinated.  - AMB Referral VBCI Care Management - Continuous Glucose Sensor (FREESTYLE LIBRE 3 PLUS SENSOR) MISC; Place on back of arm every 15 days  Dispense: 2 each; Refill: 6 - Insulin , Free (Bioactive) - IA-2 Antibody - C-peptide - Glutamic acid decarboxylase auto abs - HgB A1c - Insulin , Free and Total - IA-2 Autoantibodies - Ambulatory referral to Psychiatry - HPV 9-valent vaccine,Recombinat   Return in about 3 months (around 03/20/2025).   Sharyle Fischer, DO  "

## 2024-12-21 ENCOUNTER — Telehealth: Payer: Self-pay

## 2024-12-21 NOTE — Progress Notes (Unsigned)
 Care Guide Pharmacy Note  12/21/2024 Name: [name] MRN: [id number] DOB: 1996-08-04  Referred By: Bernardo Fend, DO Reason for referral: Complex Care Management and Call Attempt #1 (Unsuccessful initial outreach to schedule with PHARM D- Allyson)   Maria Aguilar is a 29 y.o. year old female who is a primary care patient of Bernardo Fend, DO.  Merla Sawka was referred to the pharmacist for assistance related to: DMII  An unsuccessful telephone outreach was attempted today to contact the patient who was referred to the pharmacy team for assistance with Medication Adherence. Additional attempts will be made to contact the patient.  Leotis Rase [facility], [facility] Guide  Direct Dial : [phone]  Fax [phone]

## 2024-12-24 NOTE — Progress Notes (Signed)
 Care Guide Pharmacy Note  12/24/2024 Name: Daizee Firmin MRN: [id number] DOB: 1996/04/22  Referred By: Bernardo Fend, DO Reason for referral: Complex Care Management, Call Attempt #1 (Unsuccessful initial outreach to schedule with PHARM D- Allyson), and Call Attempt #2 (Successful initial outreach scheduled with PHARM D- Allyson )   Olinda Nola is a 29 y.o. year old female who is a primary care patient of Bernardo Fend, DO.  Aalani Aikens was referred to the pharmacist for assistance related to: DMII  Successful contact was made with the patient to discuss pharmacy services including being ready for the pharmacist to call at least 5 minutes before the scheduled appointment time and to have medication bottles and any blood pressure readings ready for review. The patient agreed to meet with the pharmacist via In office on (date/time). 12/31/24 @ 2 PM.  Leotis Rase [facility], [facility] Guide  Direct Dial : [phone]  Fax [phone]

## 2024-12-30 NOTE — Progress Notes (Unsigned)
 Pre-Chart Notes:  Diabetes: Labs/BG Assessment: Pt here to discuss Omnipod *** Assess libre view Plan Omnipod >> training w/ Mliss Need a new A1c A: Inc tresiba  to 66-72 units B: increase sliding scale Follow Up    12/30/2024 Name: Maria Aguilar MRN: [id number] DOB: Aug 01, 1996  Subjective  No chief complaint on file.   Reason for visit: ?  Maria Aguilar is a 29 y.o. female with a history of diabetes (type 2), who presents today for an initial diabetes pharmacotherapy visit. They were referred by their PCP for management of DM.  Pertinent PMH also includes GERD, MDD, HS.  Known DM Complications: {lzdiabetescomplications:33838}   Care Team: Primary Care Provider: Bernardo Fend, DO   Date of Last Diabetes Related Visit: {LZ with PCP with pharmacist:31532}  Recent Summary of Change: ***  Today, ****   Medication Access/Adherence: Prescription drug coverage: Payor: [place] EMPLOYEE / Plan:  [employer] PPO / Product Type: *No Product type* / .  - Reports that all medications are *** affordable.  - Current Patient Assistance: None*** - Medication Adherence: Patient denies*** missing doses of their medication.    Since Last visit / History of Present Illness: ?  Patient reports implementing*** plan from last visit. Denies adverse effects with titration of ***.   Reported DM Regimen: ?  Tresiba  60 units daily in the morning Humalog  AC using sliding scale   DM medications tried in the past:?  Ozempic  (GI side effects ) Metforming  ({ReasonforDiscontinuation:34588}) Janumet   ({ReasonforDiscontinuation:34588}) Glipizide  Invokana   CGM: Libre 3+ ?  ***   Hypo/Hyperglycemia: ?  Symptoms of hypoglycemia since last visit:? {Blank multiple:19197::yes,no,n/a,***}  Symptoms of hyperglycemia since last visit:? {Blank multiple:19197::yes,no,n/a,***} - {symptoms; hyperglycemia:17903}  Reported Diet: Patient typically eats *** meals per day.   Breakfast: *** Lunch: *** Dinner: *** Snacks: *** Beverages: ***  Exercise: ***  DM Prevention:  Statin: Not taking; ?  History of chronic kidney disease? no History of albuminuria? yes, last UACR on 04/23/24 = 22 mg/g Lab Results  Component Value Date   MICRALBCREAT 22 04/23/2024   MICRALBCREAT 61 (H) 12/20/2023   MICRALBCREAT 29 05/09/2020   MICRALBCREAT 102 (H) 09/25/2019   MICRALBCREAT 50 (H) 01/26/2019   MICRALBCREAT 22.9 07/07/2015   ACE/ARB - Not taking ; Urine MA/CR Ratio - <30 mg/g.  Last eye exam:  Lab Results  Component Value Date   HMDIABEYEEXA  12/12/2024     Comment:     UNABLE TO DETERMINE   Last foot exam: 04/23/2024 Tobacco Use:  Tobacco Use: Low Risk (12/20/2024)   Patient History    Smoking Tobacco Use: Never    Smokeless Tobacco Use: Never    Passive Exposure: Not on file   Immunizations:? Flu: No Record - DUE ; Pneumococcal: PPSV23 (09/26/2015) ;TDap: Up to date (Last: 01/19/2023)  Cardiovascular Risk Reduction History of clinical ASCVD? no History of heart failure? no History of hyperlipidemia? yes Current BMI: 37.8 kg/m2 (Ht 66 in, Wt 106.3 kg); Taking statin? no;  Taking aspirin? not indicated; Not taking   Taking SGLT-2i? no Taking GLP- 1 RA? no      _______________________________________________  Objective     {CGM reports:33570} 7 day average blood glucose: ***  Libre3 CGM Download today *** % Time CGM is active: ***% Average Glucose: *** mg/dL Glucose Management Indicator: ***  Glucose Variability: ***% (goal <36%) Time in Goal:  - Time in range 70-180: ***% - Time above range: ***% - Time below range: ***% Observed patterns:   Physical Examination:  Vitals:  Wt Readings from Last 3 Encounters:  12/20/24 106.3 kg (234 lb 4.8 oz)  10/30/24 107.3 kg (236 lb 8 oz)  10/09/24 107.2 kg (236 lb 6.4 oz)   BP Readings from Last 3 Encounters:  12/20/24 124/82  10/30/24 (!) 133/90  10/09/24 120/74   Pulse Readings from  Last 3 Encounters:  12/20/24 (!) 102  10/30/24 89  10/09/24 96     Labs:?  Lab Results  Component Value Date   HGBA1C 12.7 (H) 07/18/2024   HGBA1C 9.8 (H) 04/23/2024   HGBA1C 11.3 (H) 02/14/2024   GLUCOSE 270 (H) 07/18/2024   MICRALBCREAT 22 04/23/2024   MICRALBCREAT 61 (H) 12/20/2023   MICRALBCREAT 29 05/09/2020   CREATININE 0.60 07/18/2024   CREATININE 0.62 04/16/2024   CREATININE 0.68 12/20/2023    Lab Results  Component Value Date   CHOL 198 07/18/2024   LDLCALC 125 (H) 07/18/2024   LDLCALC 132 (H) 04/16/2024   LDLCALC 111 (H) 05/09/2020   HDL 47 07/18/2024   TRIG 147 07/18/2024   TRIG 117 04/16/2024   TRIG 267 (H) 05/09/2020   ALT 19 04/16/2024   ALT 12 05/09/2020   AST 17 04/16/2024   AST 15 05/09/2020      Chemistry      Component Value Date/Time   NA 136 07/18/2024 1516   K 4.4 07/18/2024 1516   CL 98 07/18/2024 1516   CO2 19 (L) 07/18/2024 1516   BUN 9 07/18/2024 1516   CREATININE 0.60 07/18/2024 1516   CREATININE 0.65 05/09/2020 1127      Component Value Date/Time   CALCIUM 9.6 07/18/2024 1516   ALKPHOS 80 04/16/2024 1135   AST 17 04/16/2024 1135   ALT 19 04/16/2024 1135   BILITOT 0.4 04/16/2024 1135       The ASCVD Risk score (Arnett DK, et al., 2019) failed to calculate for the following reasons:   The 2019 ASCVD risk score is only valid for ages 74 to 33   * - Cholesterol units were assumed  Assessment and Plan:   1. Diabetes, ***Type 1 or 2?: uncontrolled per last A1c of 12.7% (07/18/24), increased from previous, 9.8%. Goal <7% without hypoglycemia. ***   Current Regimen: Tresiba  60 units dialy, Humalog  AC   HCM: {LZ HCM DM2:31604} Continue medications today without changes.  ***  Reviewed s/sx/tx hypoglycemia Future Consideration: GLP1-RA: ***. Hx negative for pancreatitis, personal/fam hx medullary thyroid  carcinoma or MEN2.  DPP4i: *** SGLT2i: *** Metformin : {LZ AP metformin :31605}; eGFR cannot be calculated (Patient's most  recent lab result is older than the maximum 21 days allowed.).  SU: Not unreasonable, though defer given alternative options with lower risk of hypoglycemia.  TZD: Avoiding due to possible weight gain/increase in fracture risk. ***CHF Insulin : Not unreasonable, though defer as last resort or as needed for severe hyperglycemia given risk hypoglycemia. Other safer options at this time.  Insulin : ***    2. HTN: controlled based on last clinic BP of 124/82 mmHg (12/20/24), goal <130/80 mmHg. Does not *** monitor BP at home. Denies lightheadedness, dizziness, SOB, CP, vision changes.  Current Regimen: *** Continue medications without changes.  Future Consideration: ACEi/ARB: *** CCB: *** Thiazide diuretic: *** MRA: *** Beta-Blocker: *** Hydralazine: *** BP Readings from Last 3 Encounters:  12/20/24 124/82  10/30/24 (!) 133/90  10/09/24 120/74     3. ASCVD (primary prevention): uncontrolled on last lipid panel with LDL 125 mg/dL, TG 852 mg/dL (1/86/74). LDL goal <70 mg/dL (primary prevention, diabetes).  Key  risk factors: {cvs risk:31001} The ASCVD Risk score (Arnett DK, et al., 2019) failed to calculate for the following reasons:   The 2019 ASCVD risk score is only valid for ages 78 to 62   * - Cholesterol units were assumed indicates patient is at *** risk.  PREVENT: 10-Yr CAD ***%, 30-Yr CAD ***% indicates patient is at *** risk.  Current Regimen: *** mg daily Continue medications today without changes.  Future Consideration: Statin: *** Ezetimibe: *** PCSK9i: ***  Lab Results  Component Value Date   LDLCALC 125 (H) 07/18/2024   LDLCALC 132 (H) 04/16/2024   TRIG 147 07/18/2024   TRIG 117 04/16/2024    Follow Up Follow up with clinical pharmacist via *** in *** weeks Patient given direct line for questions regarding medication therapy  Future Appointments  Date Time Provider Department Center  12/31/2024  2:00 PM [facility] [place]  02/12/2025  2:00 PM  Onita Duos, MD [facility] None  03/12/2025  1:00 PM [facility] [place] [facility] None  03/21/2025  8:20 AM Bernardo Fend, DO [facility] [place]  04/25/2025 11:00 AM Georgina Speaks, FNP [facility] [id number] DOROTHA Bridgette, PharmD PGY1 Health-System Pharmacy Administration and Leadership Resident [facility]

## 2024-12-31 ENCOUNTER — Ambulatory Visit (INDEPENDENT_AMBULATORY_CARE_PROVIDER_SITE_OTHER)

## 2024-12-31 ENCOUNTER — Other Ambulatory Visit: Payer: Self-pay

## 2024-12-31 DIAGNOSIS — E1165 Type 2 diabetes mellitus with hyperglycemia: Secondary | ICD-10-CM

## 2024-12-31 DIAGNOSIS — Z794 Long term (current) use of insulin: Secondary | ICD-10-CM

## 2024-12-31 MED ORDER — FREESTYLE LIBRE 2 PLUS SENSOR MISC
5 refills | Status: AC
  Filled 2024-12-31: qty 2, 30d supply, fill #0
  Filled 2025-01-04: qty 1, 15d supply, fill #0
  Filled 2025-01-04: qty 2, 30d supply, fill #0
  Filled 2025-01-04: qty 5, 75d supply, fill #0

## 2024-12-31 MED ORDER — INSULIN LISPRO 100 UNIT/ML IJ SOLN
INTRAMUSCULAR | 2 refills | Status: AC
  Filled 2024-12-31: qty 90, 45d supply, fill #0
  Filled 2025-01-04: qty 90, 90d supply, fill #0

## 2024-12-31 MED ORDER — OMNIPOD 5 LIBRE2 PLUS G6 PODS MISC
2 refills | Status: AC
  Filled 2024-12-31: qty 10, fill #0
  Filled 2025-01-02: qty 10, 30d supply, fill #0

## 2024-12-31 MED ORDER — OMNIPOD 5 LIBRE2 G6 INTRO GEN5 KIT
PACK | 0 refills | Status: AC
  Filled 2024-12-31: qty 1, fill #0
  Filled 2025-01-02 (×2): qty 1, 30d supply, fill #0

## 2025-01-01 ENCOUNTER — Other Ambulatory Visit: Payer: Self-pay

## 2025-01-02 ENCOUNTER — Other Ambulatory Visit: Payer: Self-pay

## 2025-01-02 ENCOUNTER — Telehealth: Payer: Self-pay

## 2025-01-02 NOTE — Telephone Encounter (Signed)
 Brief Telephone Documentation Reason for Call: Omnipod training scheduling  Summary of Call: [facility] pharmacy to confirm receipt of Omnipod insulin  pump supplies. Patient should be able to pick up from the pharmacy tomorrow.  Relayed information to patient. She reported she has been doing better with medication adherence for her Novolog  dinner doses, but has noticed BG has been elevated with meals. She reports self-increasing dose today to 11 units with lunch, but BG remained elevated.   Will plan to increase Novolog  dose from 9 units TID to 12 units TID. See Libreview below for the past 3 days.  Coordinated with Omnipod trainer regarding scheduling for pump training.   Follow Up: Patient given direct line for further questions/concerns.  Tristram Milian E. Marsh, PharmD, BCACP, CPP Clinical Pharmacist [facility] [phone]

## 2025-01-03 ENCOUNTER — Other Ambulatory Visit: Payer: Self-pay

## 2025-01-04 ENCOUNTER — Other Ambulatory Visit: Payer: Self-pay

## 2025-01-04 ENCOUNTER — Other Ambulatory Visit: Payer: Self-pay | Admitting: Emergency Medicine

## 2025-01-04 DIAGNOSIS — F39 Unspecified mood [affective] disorder: Secondary | ICD-10-CM

## 2025-01-04 DIAGNOSIS — F411 Generalized anxiety disorder: Secondary | ICD-10-CM

## 2025-01-04 MED ORDER — ASENAPINE MALEATE 5 MG SL SUBL
5.0000 mg | SUBLINGUAL_TABLET | Freq: Every day | SUBLINGUAL | 0 refills | Status: AC
  Filled 2025-01-04: qty 30, 30d supply, fill #0

## 2025-01-05 ENCOUNTER — Other Ambulatory Visit: Payer: Self-pay

## 2025-01-05 ENCOUNTER — Other Ambulatory Visit (HOSPITAL_COMMUNITY): Payer: Self-pay

## 2025-01-07 ENCOUNTER — Other Ambulatory Visit: Payer: Self-pay

## 2025-01-07 ENCOUNTER — Ambulatory Visit: Payer: Self-pay | Admitting: Internal Medicine

## 2025-01-08 ENCOUNTER — Other Ambulatory Visit: Payer: Self-pay

## 2025-01-09 ENCOUNTER — Other Ambulatory Visit: Payer: Self-pay

## 2025-01-09 LAB — B12 AND FOLATE PANEL
Folate: 8.5 ng/mL
Vitamin B-12: 805 pg/mL (ref 232–1245)

## 2025-01-09 LAB — INSULIN, FREE AND TOTAL

## 2025-01-09 LAB — HEMOGLOBIN A1C
Est. average glucose Bld gHb Est-mCnc: 280 mg/dL
Hgb A1c MFr Bld: 11.4 % — ABNORMAL HIGH (ref 4.8–5.6)

## 2025-01-09 LAB — GLUTAMIC ACID DECARBOXYLASE AUTO ABS: Glutamic Acid Decarb Ab: <5 U/mL (ref 0.0–5.0)

## 2025-01-09 LAB — C-PEPTIDE: C-Peptide: 1.1 ng/mL (ref 1.1–4.4)

## 2025-01-09 LAB — IA-2 AUTOANTIBODIES

## 2025-01-10 ENCOUNTER — Other Ambulatory Visit: Payer: Self-pay

## 2025-01-11 ENCOUNTER — Other Ambulatory Visit: Payer: Self-pay

## 2025-01-15 ENCOUNTER — Ambulatory Visit

## 2025-02-12 ENCOUNTER — Ambulatory Visit: Admitting: Neurology

## 2025-03-12 ENCOUNTER — Other Ambulatory Visit

## 2025-03-21 ENCOUNTER — Ambulatory Visit: Admitting: Internal Medicine

## 2025-04-25 ENCOUNTER — Encounter: Payer: Self-pay | Admitting: Nurse Practitioner
